# Patient Record
Sex: Male | Born: 1940 | Race: White | Hispanic: No | Smoking: Current every day smoker
Health system: Southern US, Community
[De-identification: ages and names within clinical notes are randomized; demographics above are authoritative.]

## PROBLEM LIST (undated history)

## (undated) DIAGNOSIS — R079 Chest pain, unspecified: Secondary | ICD-10-CM

## (undated) DIAGNOSIS — E669 Obesity, unspecified: Secondary | ICD-10-CM

## (undated) DIAGNOSIS — R0602 Shortness of breath: Secondary | ICD-10-CM

## (undated) HISTORY — DX: Shortness of breath: R06.02

## (undated) HISTORY — DX: Chest pain, unspecified: R07.9

## (undated) HISTORY — DX: Obesity, unspecified: E66.9

---

## 2006-09-17 ENCOUNTER — Ambulatory Visit: Payer: Self-pay | Admitting: Cardiology

## 2006-09-17 ENCOUNTER — Inpatient Hospital Stay (HOSPITAL_COMMUNITY): Admission: EM | Admit: 2006-09-17 | Discharge: 2006-09-21 | Payer: Self-pay | Admitting: Emergency Medicine

## 2006-09-20 ENCOUNTER — Encounter: Payer: Self-pay | Admitting: Vascular Surgery

## 2006-09-23 ENCOUNTER — Ambulatory Visit: Payer: Self-pay | Admitting: Cardiology

## 2006-09-30 ENCOUNTER — Ambulatory Visit: Payer: Self-pay | Admitting: Cardiology

## 2006-10-08 ENCOUNTER — Ambulatory Visit: Payer: Self-pay | Admitting: Cardiology

## 2006-10-08 ENCOUNTER — Encounter: Payer: Self-pay | Admitting: Nurse Practitioner

## 2006-10-15 ENCOUNTER — Ambulatory Visit: Payer: Self-pay | Admitting: Internal Medicine

## 2006-10-26 ENCOUNTER — Ambulatory Visit: Payer: Self-pay | Admitting: Cardiology

## 2006-11-16 ENCOUNTER — Ambulatory Visit: Payer: Self-pay | Admitting: Cardiology

## 2006-12-16 ENCOUNTER — Ambulatory Visit: Payer: Self-pay | Admitting: Cardiology

## 2006-12-24 ENCOUNTER — Ambulatory Visit: Payer: Self-pay | Admitting: Internal Medicine

## 2007-01-20 ENCOUNTER — Ambulatory Visit: Payer: Self-pay | Admitting: Internal Medicine

## 2007-01-20 ENCOUNTER — Ambulatory Visit: Payer: Self-pay | Admitting: Cardiology

## 2007-02-17 ENCOUNTER — Ambulatory Visit: Payer: Self-pay | Admitting: Internal Medicine

## 2007-02-17 ENCOUNTER — Ambulatory Visit: Payer: Self-pay | Admitting: Cardiology

## 2007-02-17 LAB — CONVERTED CEMR LAB
Calcium: 9.2 mg/dL (ref 8.4–10.5)
Chloride: 106 meq/L (ref 96–112)
GFR calc non Af Amer: 90 mL/min

## 2007-03-03 ENCOUNTER — Ambulatory Visit: Payer: Self-pay | Admitting: Internal Medicine

## 2007-03-31 ENCOUNTER — Ambulatory Visit: Payer: Self-pay | Admitting: Cardiology

## 2007-04-28 ENCOUNTER — Ambulatory Visit: Payer: Self-pay | Admitting: Cardiology

## 2007-06-01 ENCOUNTER — Ambulatory Visit: Payer: Self-pay | Admitting: Cardiology

## 2007-07-25 ENCOUNTER — Ambulatory Visit: Payer: Self-pay | Admitting: Cardiology

## 2007-08-08 ENCOUNTER — Ambulatory Visit: Payer: Self-pay | Admitting: Cardiology

## 2007-09-05 ENCOUNTER — Ambulatory Visit: Payer: Self-pay | Admitting: Cardiology

## 2007-09-19 ENCOUNTER — Ambulatory Visit: Payer: Self-pay | Admitting: Internal Medicine

## 2007-10-12 ENCOUNTER — Ambulatory Visit: Payer: Self-pay | Admitting: Cardiology

## 2007-12-05 ENCOUNTER — Ambulatory Visit: Payer: Self-pay | Admitting: Internal Medicine

## 2008-01-02 ENCOUNTER — Ambulatory Visit: Payer: Self-pay | Admitting: Cardiovascular Disease

## 2008-01-16 ENCOUNTER — Ambulatory Visit: Payer: Self-pay | Admitting: Internal Medicine

## 2008-01-16 LAB — CONVERTED CEMR LAB
ALT: 35 units/L (ref 0–53)
Alkaline Phosphatase: 64 units/L (ref 39–117)
BUN: 10 mg/dL (ref 6–23)
Basophils Relative: 0.4 % (ref 0.0–1.0)
CO2: 33 meq/L — ABNORMAL HIGH (ref 19–32)
Eosinophils Absolute: 0.3 10*3/uL (ref 0.0–0.7)
Eosinophils Relative: 3.4 % (ref 0.0–5.0)
GFR calc Af Amer: 96 mL/min
GFR calc non Af Amer: 79 mL/min
HDL: 39.2 mg/dL (ref >39.0)
Hemoglobin: 18.3 g/dL — ABNORMAL HIGH (ref 13.0–17.0)
Monocytes Absolute: 0.8 10*3/uL (ref 0.1–1.0)
Monocytes Relative: 10.6 % (ref 3.0–12.0)
Total Bilirubin: 1.1 mg/dL (ref 0.3–1.2)
Total CHOL/HDL Ratio: 3.6
Total Protein: 6.7 g/dL (ref 6.0–8.3)
Triglycerides: 106 mg/dL (ref 0–149)
VLDL: 21 mg/dL (ref 0–40)
WBC: 7.7 10*3/uL (ref 4.5–10.5)

## 2008-01-23 ENCOUNTER — Ambulatory Visit: Payer: Self-pay | Admitting: Internal Medicine

## 2008-01-30 ENCOUNTER — Ambulatory Visit: Payer: Self-pay | Admitting: Internal Medicine

## 2008-01-30 ENCOUNTER — Ambulatory Visit (HOSPITAL_COMMUNITY): Admission: RE | Admit: 2008-01-30 | Discharge: 2008-01-30 | Payer: Self-pay | Admitting: Internal Medicine

## 2008-02-13 ENCOUNTER — Ambulatory Visit: Payer: Self-pay | Admitting: Cardiovascular Disease

## 2008-03-12 ENCOUNTER — Ambulatory Visit: Payer: Self-pay | Admitting: Cardiology

## 2008-04-06 ENCOUNTER — Ambulatory Visit: Payer: Self-pay | Admitting: Cardiology

## 2008-04-27 ENCOUNTER — Ambulatory Visit: Payer: Self-pay | Admitting: Cardiovascular Disease

## 2008-05-25 ENCOUNTER — Ambulatory Visit: Payer: Self-pay | Admitting: Cardiovascular Disease

## 2008-06-22 ENCOUNTER — Ambulatory Visit: Payer: Self-pay | Admitting: Internal Medicine

## 2008-07-20 ENCOUNTER — Ambulatory Visit: Payer: Self-pay | Admitting: Internal Medicine

## 2008-08-17 ENCOUNTER — Ambulatory Visit: Payer: Self-pay | Admitting: Cardiology

## 2008-09-10 ENCOUNTER — Ambulatory Visit: Payer: Self-pay | Admitting: Cardiovascular Disease

## 2008-09-21 IMAGING — CT CT CHEST W/O CM
2 of 3 series · 15 of 36 positions shown, 18 images · non-contrast
Comparison: 01/20/2007 and 09/17/2006

CLINICAL DATA: Left lung nodule

CT CHEST WITHOUT CONTRAST
TECHNIQUE: Multidetector CT imaging of the chest was performed
following the standard protocol without IV contrast.

[Series 2: chest_hi_res 5.0 b40f st · axial · 0.90mm/px · z∈[-290,-56]mm · 12 of 57 slices shown, 15 images]
[im 5/57  mediastinal]
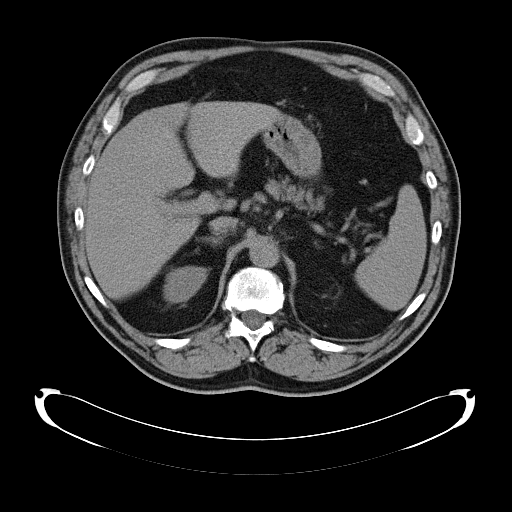
[im 5/57  lung]
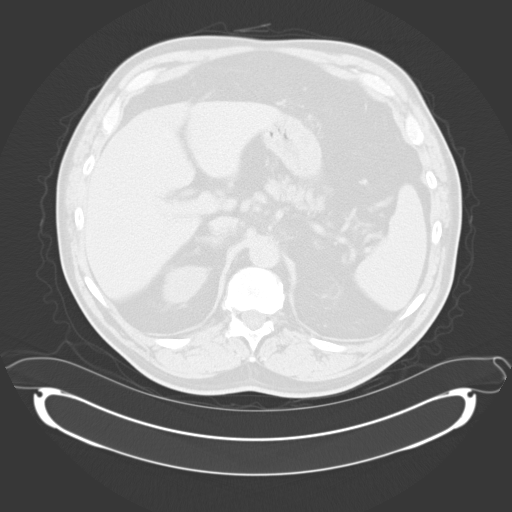
[im 9/57  lung]
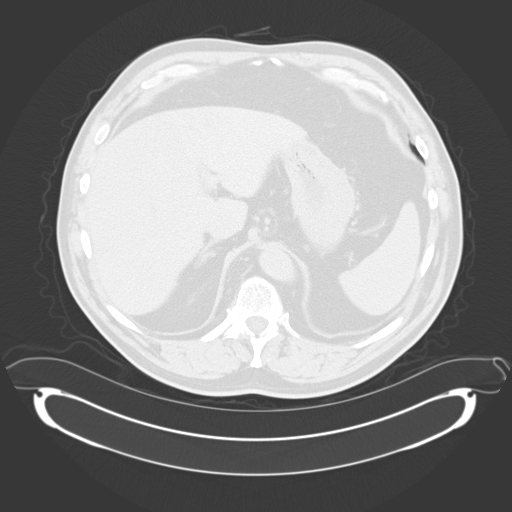
[im 13/57  lung]
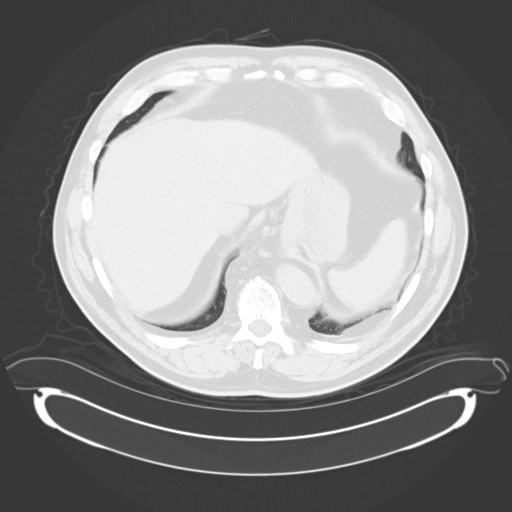
[im 17/57  lung]
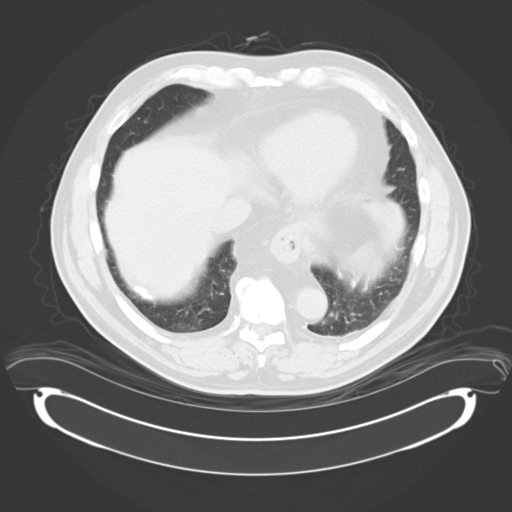
[im 21/57  mediastinal]
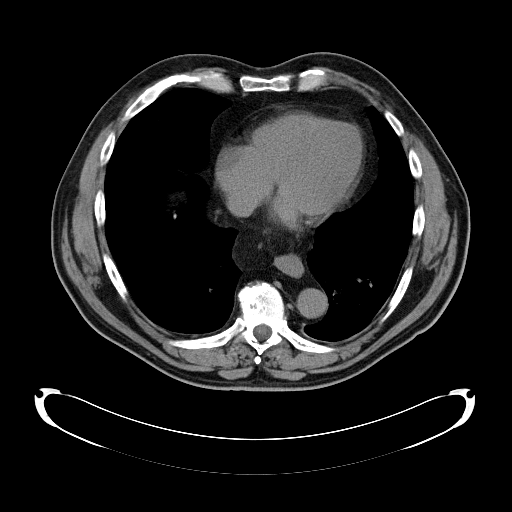
[im 21/57  lung]
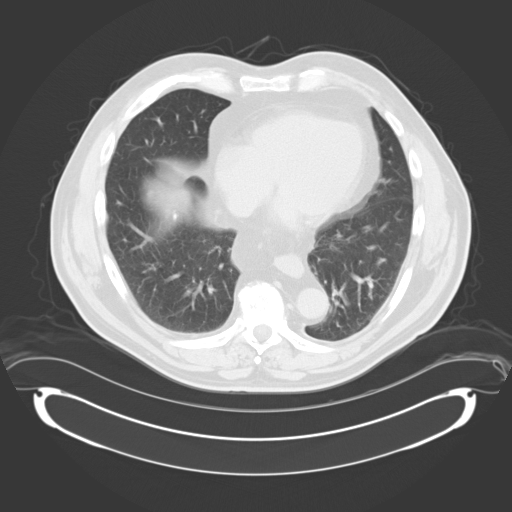
[im 25/57  lung]
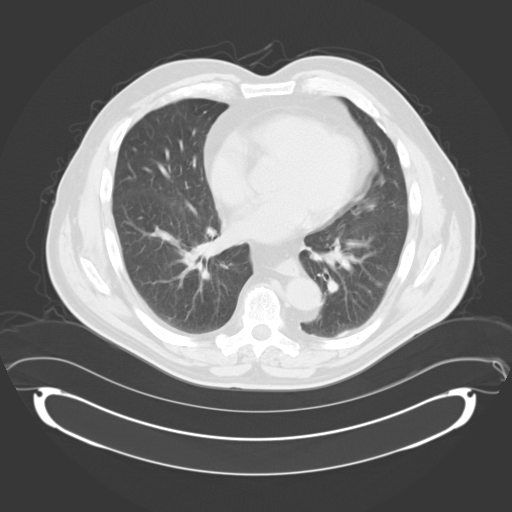
[im 32/57  lung]
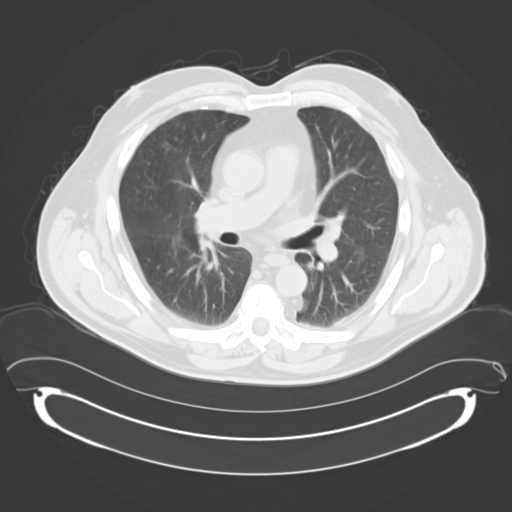
[im 36/57  lung]
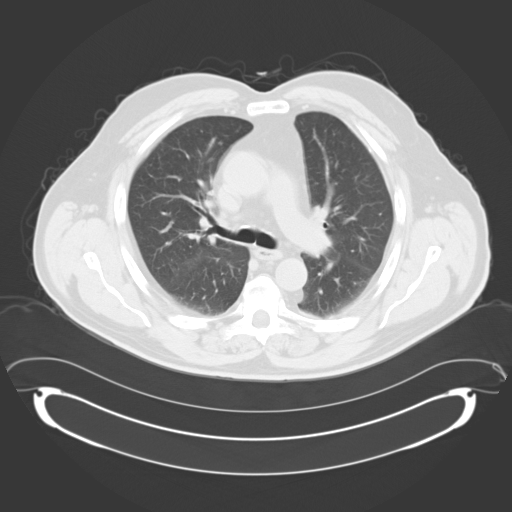
[im 40/57  mediastinal]
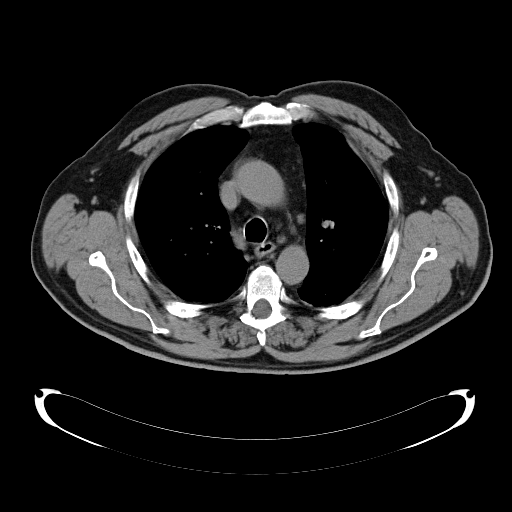
[im 40/57  lung]
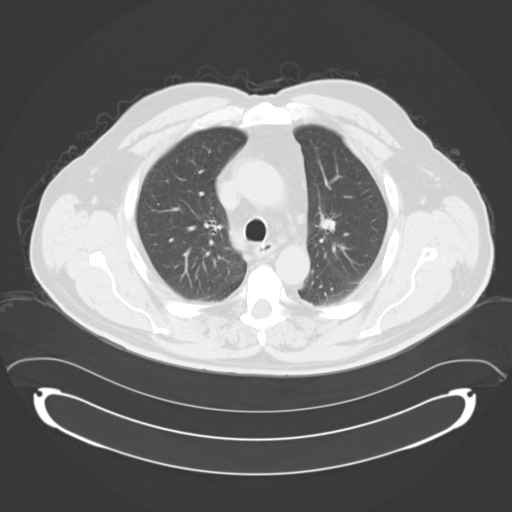
[im 44/57  lung]
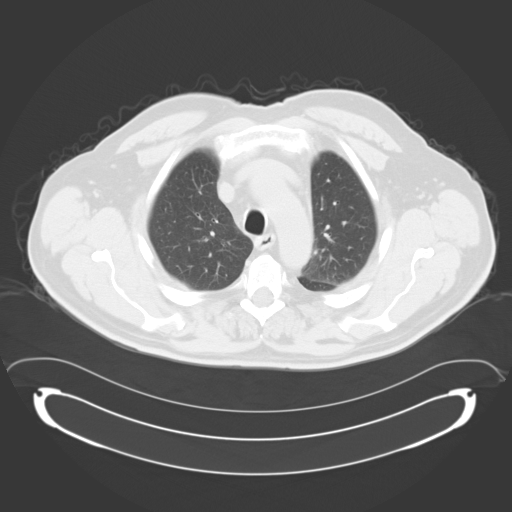
[im 48/57  lung]
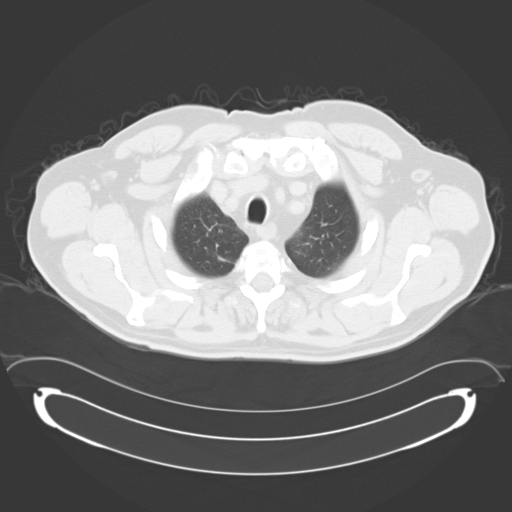
[im 52/57  lung]
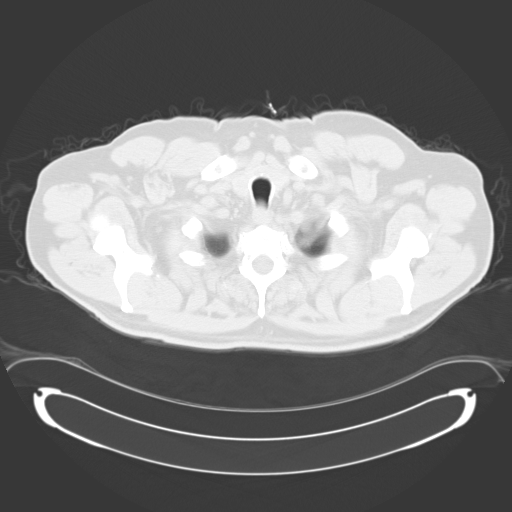

[Series 602: <mpr thick range> · coronal · 0.90mm/px · 3 of 103 slices shown]
[im 21/103  lung]
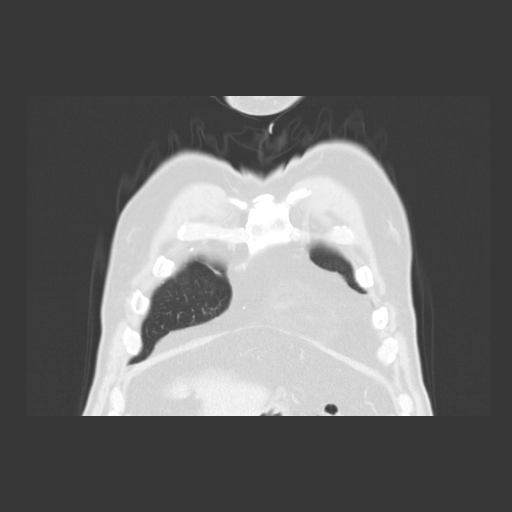
[im 41/103  lung]
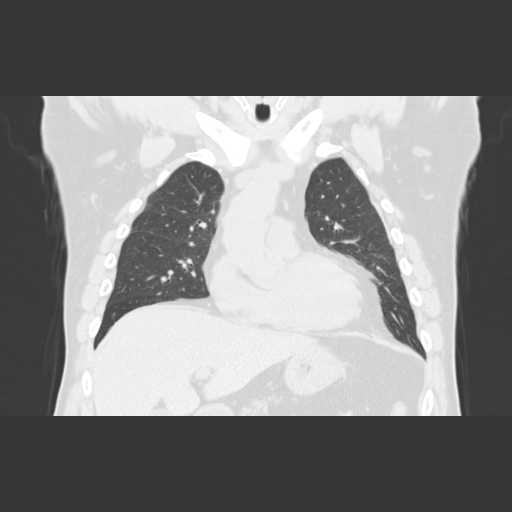
[im 62/103  lung]
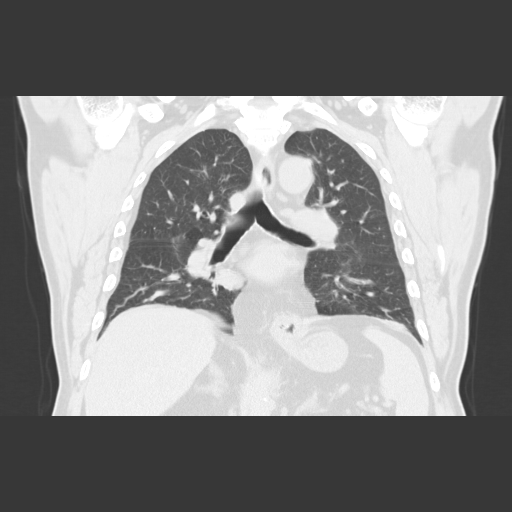

[15 of 36 positions shown; findings below may reference images not displayed]

FINDINGS: There is no axillary, mediastinal, or hilar
lymphadenopathy.  Heart size is normal.  No pericardial or pleural
effusion.  A small hiatal hernia is noted.

The the patient is breathing during image acquisition which
obscures fine detail of the lung parenchyma.  The tiny subpleural
left lower lobe pulmonary nodule is unchanged since the 09/17/2006
exam.  On the more recent comparison study a nodule was seen in the
posterior right apex .  This is less prominent in the interval and
remains compatible with scarring.  A focal area of fatty pleural
thickening in the posterior left hemithorax is stable.  A pleural
calcification at the right lung base is unchanged.
IMPRESSION: Stable appearance of a 4 mm left lower lobe subpleural nodule in
the 12 months since the prior study with 16 months of total imaging
stability.  In a patient with no history of malignancy or smoking,
no additional follow-up of this nodule is indicated.  In a high
risk patient, one additional scan in 8-12 months could be performed
to document 24 months of imaging stability.

## 2008-10-01 ENCOUNTER — Ambulatory Visit: Payer: Self-pay | Admitting: Cardiology

## 2008-10-29 ENCOUNTER — Ambulatory Visit: Payer: Self-pay | Admitting: Cardiology

## 2008-11-19 ENCOUNTER — Ambulatory Visit: Payer: Self-pay | Admitting: Cardiology

## 2008-12-19 ENCOUNTER — Ambulatory Visit: Payer: Self-pay | Admitting: Cardiology

## 2009-01-16 ENCOUNTER — Ambulatory Visit: Payer: Self-pay | Admitting: Internal Medicine

## 2009-02-18 ENCOUNTER — Ambulatory Visit: Payer: Self-pay | Admitting: Cardiovascular Disease

## 2009-03-12 ENCOUNTER — Encounter: Payer: Self-pay | Admitting: *Deleted

## 2009-03-12 ENCOUNTER — Ambulatory Visit: Payer: Self-pay | Admitting: Cardiovascular Disease

## 2009-04-09 ENCOUNTER — Ambulatory Visit: Payer: Self-pay | Admitting: Cardiology

## 2009-04-09 LAB — CONVERTED CEMR LAB: POC INR: 2.7

## 2009-04-16 ENCOUNTER — Encounter: Payer: Self-pay | Admitting: Cardiology

## 2009-04-17 ENCOUNTER — Encounter: Payer: Self-pay | Admitting: *Deleted

## 2009-05-07 ENCOUNTER — Ambulatory Visit: Payer: Self-pay | Admitting: Cardiology

## 2009-05-07 LAB — CONVERTED CEMR LAB: POC INR: 2.3

## 2009-06-04 ENCOUNTER — Ambulatory Visit: Payer: Self-pay | Admitting: Cardiovascular Disease

## 2009-06-04 LAB — CONVERTED CEMR LAB: POC INR: 2.1

## 2009-07-09 ENCOUNTER — Ambulatory Visit: Payer: Self-pay | Admitting: Cardiology

## 2009-07-09 LAB — CONVERTED CEMR LAB: POC INR: 2.7

## 2009-08-06 ENCOUNTER — Ambulatory Visit: Payer: Self-pay | Admitting: Cardiology

## 2009-08-27 ENCOUNTER — Ambulatory Visit: Payer: Self-pay | Admitting: Cardiology

## 2009-09-24 ENCOUNTER — Ambulatory Visit: Payer: Self-pay | Admitting: Cardiovascular Disease

## 2009-09-24 LAB — CONVERTED CEMR LAB: POC INR: 2.3

## 2009-10-24 ENCOUNTER — Ambulatory Visit: Payer: Self-pay | Admitting: Cardiology

## 2009-11-21 ENCOUNTER — Ambulatory Visit: Payer: Self-pay | Admitting: Cardiology

## 2009-12-12 ENCOUNTER — Ambulatory Visit: Payer: Self-pay | Admitting: Internal Medicine

## 2010-01-02 ENCOUNTER — Ambulatory Visit: Payer: Self-pay | Admitting: Internal Medicine

## 2010-01-02 LAB — CONVERTED CEMR LAB: POC INR: 3

## 2010-03-04 ENCOUNTER — Encounter (INDEPENDENT_AMBULATORY_CARE_PROVIDER_SITE_OTHER): Payer: Self-pay | Admitting: Pharmacist

## 2010-03-19 ENCOUNTER — Ambulatory Visit: Payer: Self-pay | Admitting: Cardiology

## 2010-03-19 LAB — CONVERTED CEMR LAB: POC INR: 2.8

## 2010-04-17 ENCOUNTER — Ambulatory Visit: Payer: Self-pay | Admitting: Internal Medicine

## 2010-04-17 LAB — CONVERTED CEMR LAB: POC INR: 2

## 2010-05-15 ENCOUNTER — Ambulatory Visit: Payer: Self-pay | Admitting: Internal Medicine

## 2010-06-12 ENCOUNTER — Ambulatory Visit: Payer: Self-pay | Admitting: Cardiology

## 2010-06-12 LAB — CONVERTED CEMR LAB: POC INR: 2.4

## 2010-07-10 ENCOUNTER — Ambulatory Visit: Payer: Self-pay | Admitting: Cardiology

## 2010-07-10 LAB — CONVERTED CEMR LAB: POC INR: 3

## 2010-08-07 ENCOUNTER — Ambulatory Visit: Payer: Self-pay | Admitting: Cardiology

## 2010-09-03 ENCOUNTER — Ambulatory Visit: Payer: Self-pay | Admitting: Internal Medicine

## 2010-09-03 LAB — CONVERTED CEMR LAB: POC INR: 2.3

## 2010-10-01 ENCOUNTER — Ambulatory Visit: Payer: Self-pay | Admitting: Internal Medicine

## 2010-10-01 LAB — CONVERTED CEMR LAB: POC INR: 2

## 2010-10-29 ENCOUNTER — Ambulatory Visit: Admission: RE | Admit: 2010-10-29 | Discharge: 2010-10-29 | Payer: Self-pay | Source: Home / Self Care

## 2010-11-02 ENCOUNTER — Encounter: Payer: Self-pay | Admitting: Internal Medicine

## 2010-11-11 NOTE — Medication Information (Signed)
Summary: cvrr  Anticoagulant Therapy  Managed by: Elaina Pattee, PharmD Referring MD: Arvilla Meres Supervising MD: Juanda Chance MD, Draco Malczewski Indication 1: Pulmonary embolus (ICD-415.19) Lab Used: [facility] Site: [facility] INR POC 2.8 INR RANGE 2 - 3  Dietary changes: no    Health status changes: no    Bleeding/hemorrhagic complications: no    Recent/future hospitalizations: no    Any changes in medication regimen? no    Recent/future dental: no  Any missed doses?: no       Is patient compliant with meds? yes       Allergies: No Known Drug Allergies  Anticoagulation Management History:      The patient is taking warfarin and comes in today for a routine follow up visit.  Positive risk factors for bleeding include an age of 11 years or older.  The bleeding index is 'intermediate risk'.  Negative CHADS2 values include Age > 50 years old.  The start date was 09/17/2006.  Anticoagulation responsible provider: Juanda Chance MD, Smitty Cords.  INR POC: 2.8.  Cuvette Lot#: [id number].  Exp: 05/2011.    Anticoagulation Management Assessment/Plan:      The patient's current anticoagulation dose is Coumadin 5 mg tabs: Use as directed by anticoagulation clinic.  The target INR is 2 - 3.  The next INR is due 04/16/2010.  Anticoagulation instructions were given to patient.  Results were reviewed/authorized by Elaina Pattee, PharmD.  He was notified by Elaina Pattee, PharmD.         Prior Anticoagulation Instructions: INR 3.0  Continue taking 1/2 tablet on Tuesday, Thursday, and Saturday, and take 1 tablet all other days.  Return to clinic in 4 weeks.  May restart greens in diet.    Current Anticoagulation Instructions: INR 2.8. Take 1 tablet daily except 0.5 tablet on Tues, Thurs, Sat.  Recheck in 4 weeks.

## 2010-11-11 NOTE — Medication Information (Signed)
Summary: [facility]  Anticoagulant Therapy  Managed by: Cloyde Reams, RN, BSN Referring MD: Arvilla Meres Supervising MD: Tenny Craw MD, Gunnar Fusi Indication 1: Pulmonary embolus (ICD-415.19) Lab Used: [facility] Site: [facility] INR POC 1.6 INR RANGE 2 - 3  Dietary changes: yes       Details: Less vit K this past week.  Health status changes: no    Bleeding/hemorrhagic complications: no    Recent/future hospitalizations: no    Any changes in medication regimen? no    Recent/future dental: no  Any missed doses?: no       Is patient compliant with meds? yes       Allergies (verified): No Known Drug Allergies  Anticoagulation Management History:      The patient is taking warfarin and comes in today for a routine follow up visit.  Positive risk factors for bleeding include an age of 71 years or older.  The bleeding index is 'intermediate risk'.  Negative CHADS2 values include Age > 69 years old.  The start date was 09/17/2006.  Anticoagulation responsible provider: Tenny Craw MD, Gunnar Fusi.  INR POC: 1.6.  Cuvette Lot#: [id number].  Exp: 02/2011.    Anticoagulation Management Assessment/Plan:      The patient's current anticoagulation dose is Coumadin 5 mg tabs: Use as directed by anticoagulation clinic.  The target INR is 2 - 3.  The next INR is due 01/02/2010.  Anticoagulation instructions were given to patient.  Results were reviewed/authorized by Cloyde Reams, RN, BSN.  He was notified by Cloyde Reams RN.         Prior Anticoagulation Instructions: Take 5mg  today (Thursday) then resume 2.5mg  daily except 5mg  on MWF.  Current Anticoagulation Instructions: INR 1.6  Take 5mg  today then start taking 1 tablet daily except 1/2 tablet on Tuesdays, Thursdays, and Saturdays.  Recheck 3 weeks.

## 2010-11-11 NOTE — Medication Information (Signed)
Summary: rov/ewj  Anticoagulant Therapy  Managed by: Eda Keys, PharmD Referring MD: Arvilla Meres Supervising MD: Gala Romney MD, Reuel Boom Indication 1: Pulmonary embolus (ICD-415.19) Lab Used: [facility] Site: [facility] INR POC 3.0 INR RANGE 2 - 3  Dietary changes: yes       Details: Pt has stopped eating as many greens due to previously low INRs  Health status changes: no    Bleeding/hemorrhagic complications: no    Recent/future hospitalizations: no    Any changes in medication regimen? no    Recent/future dental: no  Any missed doses?: no       Is patient compliant with meds? yes       Allergies: No Known Drug Allergies  Anticoagulation Management History:      The patient is taking warfarin and comes in today for a routine follow up visit.  Positive risk factors for bleeding include an age of 71 years or older.  The bleeding index is 'intermediate risk'.  Negative CHADS2 values include Age > 71 years old.  The start date was 09/17/2006.  Anticoagulation responsible provider: Reginae Wolfrey MD, Reuel Boom.  INR POC: 3.0.  Cuvette Lot#: [id number].  Exp: 02/2011.    Anticoagulation Management Assessment/Plan:      The patient's current anticoagulation dose is Coumadin 5 mg tabs: Use as directed by anticoagulation clinic.  The target INR is 2 - 3.  The next INR is due 01/30/2010.  Anticoagulation instructions were given to patient.  Results were reviewed/authorized by Eda Keys, PharmD.  He was notified by Eda Keys.         Prior Anticoagulation Instructions: INR 1.6  Take 5mg  today then start taking 1 tablet daily except 1/2 tablet on Tuesdays, Thursdays, and Saturdays.  Recheck 3 weeks.    Current Anticoagulation Instructions: INR 3.0  Continue taking 1/2 tablet on Tuesday, Thursday, and Saturday, and take 1 tablet all other days.  Return to clinic in 4 weeks.  May restart greens in diet.

## 2010-11-11 NOTE — Medication Information (Signed)
Summary: rov/sp  Anticoagulant Therapy  Managed by: Cloyde Reams, RN, BSN Referring MD: Arvilla Meres Supervising MD: Tenny Craw MD, Gunnar Fusi Indication 1: Pulmonary embolus (ICD-415.19) Lab Used: [facility] Site: [facility] INR POC 2.0 INR RANGE 2 - 3  Dietary changes: no    Health status changes: no    Bleeding/hemorrhagic complications: no    Recent/future hospitalizations: no    Any changes in medication regimen? no    Recent/future dental: no  Any missed doses?: yes     Details: Pt went off coumadin x 8 days to donate blood, resumed coumadin 2 weeks ago.   Is patient compliant with meds? yes       Allergies: No Known Drug Allergies  Anticoagulation Management History:      The patient is taking warfarin and comes in today for a routine follow up visit.  Positive risk factors for bleeding include an age of 71 years or older.  The bleeding index is 'intermediate risk'.  Negative CHADS2 values include Age > 73 years old.  The start date was 09/17/2006.  Anticoagulation responsible provider: Tenny Craw MD, Gunnar Fusi.  INR POC: 2.0.  Cuvette Lot#: [id number].  Exp: 06/2011.    Anticoagulation Management Assessment/Plan:      The patient's current anticoagulation dose is Coumadin 5 mg tabs: Use as directed by anticoagulation clinic.  The target INR is 2 - 3.  The next INR is due 05/15/2010.  Anticoagulation instructions were given to patient.  Results were reviewed/authorized by Cloyde Reams, RN, BSN.  He was notified by Cloyde Reams RN.         Prior Anticoagulation Instructions: INR 2.8. Take 1 tablet daily except 0.5 tablet on Tues, Thurs, Sat.  Recheck in 4 weeks.  Current Anticoagulation Instructions: INR 2.0  Continue on same dosage 1 tablet daily except 1/2 tablet on Tuesdays, Thursdays, and Saturdays.  Recheck in 4 weeks.

## 2010-11-11 NOTE — Medication Information (Signed)
Summary: [facility]  Anticoagulant Therapy  Managed by: Bethena Midget, RN Referring MD: Arvilla Meres Supervising MD: Shirlee Latch MD, Breanna Mcdaniel Indication 1: Pulmonary embolus (ICD-415.19) Lab Used: [facility] Site: [facility] INR POC 2.2 INR RANGE 2 - 3  Dietary changes: no    Health status changes: no    Bleeding/hemorrhagic complications: no    Recent/future hospitalizations: no    Any changes in medication regimen? no    Recent/future dental: no  Any missed doses?: no       Is patient compliant with meds? yes       Allergies (verified): No Known Drug Allergies  Anticoagulation Management History:      The patient is taking warfarin and comes in today for a routine follow up visit.  Positive risk factors for bleeding include an age of 71 years or older.  The bleeding index is 'intermediate risk'.  Negative CHADS2 values include Age > 71 years old.  The start date was 09/17/2006.  Anticoagulation responsible provider: Shirlee Latch MD, Aamiyah Derrick.  INR POC: 2.2.  Cuvette Lot#: [id number].  Exp: [id number].    Anticoagulation Management Assessment/Plan:      The patient's current anticoagulation dose is Coumadin 5 mg tabs: Use as directed by anticoagulation clinic.  The target INR is 2 - 3.  The next INR is due 11/21/2009.  Anticoagulation instructions were given to patient.  Results were reviewed/authorized by Bethena Midget, RN.  He was notified by Ysidro Evert, Pharm D Candidate.         Prior Anticoagulation Instructions: INR 2.3  Continue on same dosage 2.5mg  daily except 5mg  on Mondays, Wednesdays, and Fridays.  Recheck in 4 weeks.    Current Anticoagulation Instructions: INR: 2.2 Continue with same dosage 2.5mg  daily except 5mg  on Mondays, Wednesdays and Fridays Recheck in 4 weeks

## 2010-11-11 NOTE — Medication Information (Signed)
Summary: rov/ewj  Anticoagulant Therapy  Managed by: Cloyde Reams, RN, BSN Referring MD: Arvilla Meres Supervising MD: Tenny Craw MD, Gunnar Fusi Indication 1: Pulmonary embolus (ICD-415.19) Lab Used: [facility] Site: [facility] INR POC 2.1 INR RANGE 2 - 3  Dietary changes: yes       Details: Incr vit K x past 3 days.    Health status changes: no    Bleeding/hemorrhagic complications: no    Recent/future hospitalizations: no    Any changes in medication regimen? no    Recent/future dental: no  Any missed doses?: no       Is patient compliant with meds? yes       Allergies: No Known Drug Allergies  Anticoagulation Management History:      The patient is taking warfarin and comes in today for a routine follow up visit.  Positive risk factors for bleeding include an age of 71 years or older.  The bleeding index is 'intermediate risk'.  Negative CHADS2 values include Age > 9 years old.  The start date was 09/17/2006.  Anticoagulation responsible provider: Tenny Craw MD, Gunnar Fusi.  INR POC: 2.1.  Cuvette Lot#: [id number].  Exp: 07/2011.    Anticoagulation Management Assessment/Plan:      The patient's current anticoagulation dose is Coumadin 5 mg tabs: Use as directed by anticoagulation clinic.  The target INR is 2 - 3.  The next INR is due 06/12/2010.  Anticoagulation instructions were given to patient.  Results were reviewed/authorized by Cloyde Reams, RN, BSN.  He was notified by Cloyde Reams RN.         Prior Anticoagulation Instructions: INR 2.0  Continue on same dosage 1 tablet daily except 1/2 tablet on Tuesdays, Thursdays, and Saturdays.  Recheck in 4 weeks.   Current Anticoagulation Instructions: INR 2.1  Continue on same dosage 1 tablet daily except 1/2 tablet on Tuesdays, Thursdays, and Saturdays.  Recheck in 4 weeks.

## 2010-11-11 NOTE — Letter (Signed)
Summary: [facility]ou[facility]in  [address]   [place], [place] [address]   Phone: [phone]  Fax: [phone]     Mar 04, 2010 MRN: [id number]   Jake Richmond [address] [place], [place]  [address]   Dear [name],  This letter is being sent to you as a reminder that it is necessary for you to get your INR/PT checked regularly so that we can optimize your care.  Our records indicate that you were scheduled to have a test done recently.  As of today, we have not received the results of this test.  It is very important that you have your INR checked.  Please call our office at the number listed above to schedule an appointment at your earliest convenience.    If you have recently had your protime checked or have discontinued this medication, please contact our office at the above phone number to clarify this issue.  Thank you for this prompt attention to this important health care matter.  Sincerely,   [facility] Team

## 2010-11-11 NOTE — Medication Information (Signed)
Summary: rov/ewj  Anticoagulant Therapy  Managed by: Weston Brass, PharmD Referring MD: Arvilla Meres Supervising MD: Jens Som MD, Arlys John Indication 1: Pulmonary embolus (ICD-415.19) Lab Used: [facility] Site: [facility] INR POC 2.4 INR RANGE 2 - 3  Dietary changes: yes       Details: decreased vitamin k intake   Health status changes: no    Bleeding/hemorrhagic complications: no    Recent/future hospitalizations: no    Any changes in medication regimen? no    Recent/future dental: no  Any missed doses?: no       Is patient compliant with meds? yes       Allergies: No Known Drug Allergies  Anticoagulation Management History:      The patient is taking warfarin and comes in today for a routine follow up visit.  Positive risk factors for bleeding include an age of 71 years or older.  The bleeding index is 'intermediate risk'.  Negative CHADS2 values include Age > 69 years old.  The start date was 09/17/2006.  Anticoagulation responsible provider: Jens Som MD, Arlys John.  INR POC: 2.4.  Cuvette Lot#: [id number].  Exp: 07/2011.    Anticoagulation Management Assessment/Plan:      The patient's current anticoagulation dose is Coumadin 5 mg tabs: Use as directed by anticoagulation clinic.  The target INR is 2 - 3.  The next INR is due 07/10/2010.  Anticoagulation instructions were given to patient.  Results were reviewed/authorized by Weston Brass, PharmD.  He was notified by Weston Brass PharmD.         Prior Anticoagulation Instructions: INR 2.1  Continue on same dosage 1 tablet daily except 1/2 tablet on Tuesdays, Thursdays, and Saturdays.  Recheck in 4 weeks.   Current Anticoagulation Instructions: INR 2.4  Continue same dose of 1 tablet every day except 1/2 tablet on Tuesday, Thursday and Saturday.  Recheck INR in 4 weeks.

## 2010-11-11 NOTE — Medication Information (Signed)
Summary: rov/eac  Anticoagulant Therapy  Managed by: Charolotte Eke, PharmD Referring MD: Arvilla Meres Supervising MD: Daleen Squibb MD, Maisie Fus Indication 1: Pulmonary embolus (ICD-415.19) Lab Used: [facility] Site: [facility] INR POC 1.9 INR RANGE 2 - 3  Dietary changes: no    Health status changes: no    Bleeding/hemorrhagic complications: no    Recent/future hospitalizations: no    Any changes in medication regimen? no    Recent/future dental: no  Any missed doses?: no       Is patient compliant with meds? yes       Current Medications (verified): 1)  Aspirin 81 Mg Tbec (Aspirin) .... Take One Tablet By Mouth Daily 2)  Coumadin 5 Mg Tabs (Warfarin Sodium) .... Use As Directed By Anticoagulation Clinic  Allergies (verified): No Known Drug Allergies  Anticoagulation Management History:      Positive risk factors for bleeding include an age of 16 years or older.  The bleeding index is 'intermediate risk'.  Negative CHADS2 values include Age > 44 years old.  The start date was 09/17/2006.  Anticoagulation responsible provider: Daleen Squibb MD, Maisie Fus.  INR POC: 1.9.  Exp: [id number].    Anticoagulation Management Assessment/Plan:      The patient's current anticoagulation dose is Coumadin 5 mg tabs: Use as directed by anticoagulation clinic.  The target INR is 2 - 3.  The next INR is due 12/12/2009.  Anticoagulation instructions were given to patient.  Results were reviewed/authorized by Charolotte Eke, PharmD.  He was notified by Charolotte Eke, PharmD.         Prior Anticoagulation Instructions: INR: 2.2 Continue with same dosage 2.5mg  daily except 5mg  on Mondays, Wednesdays and Fridays Recheck in 4 weeks  Current Anticoagulation Instructions: Take 5mg  today (Thursday) then resume 2.5mg  daily except 5mg  on MWF.

## 2010-11-11 NOTE — Medication Information (Signed)
Summary: rov/sp   Anticoagulant Therapy  Managed by: Weston Brass, PharmD Referring MD: Arvilla Meres Supervising MD: Jens Som MD, Arlys John Indication 1: Pulmonary embolus (ICD-415.19) Lab Used: [facility] Site: [facility] INR POC 3 INR RANGE 2 - 3  Dietary changes: yes       Details: eating less greens  Health status changes: no    Bleeding/hemorrhagic complications: no    Recent/future hospitalizations: no    Any changes in medication regimen? no    Recent/future dental: no  Any missed doses?: no       Is patient compliant with meds? yes       Allergies: No Known Drug Allergies  Anticoagulation Management History:      The patient is taking warfarin and comes in today for a routine follow up visit.  Positive risk factors for bleeding include an age of 71 years or older.  The bleeding index is 'intermediate risk'.  Negative CHADS2 values include Age > 4 years old.  The start date was 09/17/2006.  Anticoagulation responsible provider: Jens Som MD, Arlys John.  INR POC: 3.  Cuvette Lot#: [id number].  Exp: 07/2011.    Anticoagulation Management Assessment/Plan:      The patient's current anticoagulation dose is Coumadin 5 mg tabs: Use as directed by anticoagulation clinic.  The target INR is 2 - 3.  The next INR is due 08/07/2010.  Anticoagulation instructions were given to patient.  Results were reviewed/authorized by Weston Brass, PharmD.         Prior Anticoagulation Instructions: INR 2.4  Continue same dose of 1 tablet every day except 1/2 tablet on Tuesday, Thursday and Saturday.  Recheck INR in 4 weeks.   Current Anticoagulation Instructions: INR 3 Continue taking a half tablet on tuesday, thursday, and saturday. And 1 tablet all other days. Recheck in 4 weeks.

## 2010-11-11 NOTE — Medication Information (Signed)
Summary: Coumadin Clinic      Allergies Added: NKDA Anticoagulant Therapy  Managed by: Reina Fuse, PharmD Referring MD: Arvilla Meres Supervising MD: Jens Som MD, Arlys John Indication 1: Pulmonary embolus (ICD-415.19) Lab Used: [facility] Site: [facility] INR POC 2.5 INR RANGE 2 - 3  Dietary changes: no    Health status changes: no    Bleeding/hemorrhagic complications: no    Recent/future hospitalizations: no    Any changes in medication regimen? no    Recent/future dental: no  Any missed doses?: yes     Details: Missed dose Tuesday (2.5 mg).  Is patient compliant with meds? yes       Current Medications (verified): 1)  Aspirin 81 Mg Tbec (Aspirin) .... Take One Tablet By Mouth Daily 2)  Coumadin 5 Mg Tabs (Warfarin Sodium) .... Use As Directed By Anticoagulation Clinic  Allergies (verified): No Known Drug Allergies  Anticoagulation Management History:      The patient is taking warfarin and comes in today for a routine follow up visit.  Positive risk factors for bleeding include an age of 71 years or older.  The bleeding index is 'intermediate risk'.  Negative CHADS2 values include Age > 38 years old.  The start date was 09/17/2006.  Anticoagulation responsible Lenka Zhao: Jens Som MD, Arlys John.  INR POC: 2.5.  Cuvette Lot#: [id number].  Exp: 07/2011.    Anticoagulation Management Assessment/Plan:      The patient's current anticoagulation dose is Coumadin 5 mg tabs: Use as directed by anticoagulation clinic.  The target INR is 2 - 3.  The next INR is due 09/03/2010.  Anticoagulation instructions were given to patient.  Results were reviewed/authorized by Reina Fuse, PharmD.  He was notified by Reina Fuse PharmD.         Prior Anticoagulation Instructions: INR 3 Continue taking a half tablet on tuesday, thursday, and saturday. And 1 tablet all other days. Recheck in 4 weeks.  Current Anticoagulation Instructions: INR 2.5  Continue taking Coumadin 5 mg (1 tab) on Sun,  Mon, Wed, Fri and Coumadin 2.5 mg (0.5 tab) on Tues, Thur, Sat. Return to clinic in 4 weeks.

## 2010-11-11 NOTE — Medication Information (Signed)
Summary: rov/sl   Anticoagulant Therapy  Managed by: Weston Brass, PharmD Referring MD: Arvilla Meres Supervising MD: Gala Romney MD, Reuel Boom Indication 1: Pulmonary embolus (ICD-415.19) Lab Used: [facility] Site: [facility] INR POC 2.3 INR RANGE 2 - 3  Dietary changes: yes       Details: decreased greens  Health status changes: no    Bleeding/hemorrhagic complications: no    Recent/future hospitalizations: no    Any changes in medication regimen? no    Recent/future dental: no  Any missed doses?: no       Is patient compliant with meds? yes       Allergies: No Known Drug Allergies  Anticoagulation Management History:      The patient is taking warfarin and comes in today for a routine follow up visit.  Positive risk factors for bleeding include an age of 71 years or older.  The bleeding index is 'intermediate risk'.  Negative CHADS2 values include Age > 41 years old.  The start date was 09/17/2006.  Anticoagulation responsible provider: Ladajah Soltys MD, Reuel Boom.  INR POC: 2.3.  Cuvette Lot#: [id number].  Exp: 09/2011.    Anticoagulation Management Assessment/Plan:      The patient's current anticoagulation dose is Coumadin 5 mg tabs: Use as directed by anticoagulation clinic.  The target INR is 2 - 3.  The next INR is due 10/01/2010.  Anticoagulation instructions were given to patient.  Results were reviewed/authorized by Weston Brass, PharmD.  He was notified by Weston Brass PharmD.         Prior Anticoagulation Instructions: INR 2.5  Continue taking Coumadin 5 mg (1 tab) on Sun, Mon, Wed, Fri and Coumadin 2.5 mg (0.5 tab) on Tues, Thur, Sat. Return to clinic in 4 weeks.   Current Anticoagulation Instructions: INR 2.3  Continue same dose of 1 tablet every day except 1/2 tablet on Tuesday, Thursday and Saturday.  Recheck INR in 4 weeks.

## 2010-11-13 NOTE — Medication Information (Signed)
Summary: rov/sp   Anticoagulant Therapy  Managed by: Weston Brass, PharmD Referring MD: Arvilla Meres Supervising MD: Gala Romney MD, Reuel Boom Indication 1: Pulmonary embolus (ICD-415.19) Lab Used: [facility] Site: [facility] INR POC 2.0 INR RANGE 2 - 3  Dietary changes: no    Health status changes: no    Bleeding/hemorrhagic complications: no    Recent/future hospitalizations: no    Any changes in medication regimen? no    Recent/future dental: no  Any missed doses?: no       Is patient compliant with meds? yes       Allergies: No Known Drug Allergies  Anticoagulation Management History:      The patient is taking warfarin and comes in today for a routine follow up visit.  Positive risk factors for bleeding include an age of 5 years or older.  The bleeding index is 'intermediate risk'.  Negative CHADS2 values include Age > 23 years old.  The start date was 09/17/2006.  Anticoagulation responsible provider: Bensimhon MD, Reuel Boom.  INR POC: 2.0.  Cuvette Lot#: [id number].  Exp: 11/2011.    Anticoagulation Management Assessment/Plan:      The patient's current anticoagulation dose is Coumadin 5 mg tabs: Use as directed by anticoagulation clinic.  The target INR is 2 - 3.  The next INR is due 10/29/2010.  Anticoagulation instructions were given to patient.  Results were reviewed/authorized by Weston Brass, PharmD.  He was notified by Weston Brass PharmD.         Prior Anticoagulation Instructions: INR 2.3  Continue same dose of 1 tablet every day except 1/2 tablet on Tuesday, Thursday and Saturday.  Recheck INR in 4 weeks.   Current Anticoagulation Instructions: INR 2.0  Continue same dose of 1 tablet every day except 1/2 tablet on Tuesday, Thursday and Saturday.  Recheck INR in 4 weeks.

## 2010-11-13 NOTE — Medication Information (Signed)
Summary: rov/tp   Anticoagulant Therapy  Managed by: Geoffry Paradise, PharmD Referring MD: Arvilla Meres Supervising MD: Shirlee Latch MD, Freida Busman Indication 1: Pulmonary embolus (ICD-415.19) Lab Used: [facility] Site: [facility] INR POC 2.7 INR RANGE 2 - 3  Dietary changes: no    Health status changes: no    Bleeding/hemorrhagic complications: no    Recent/future hospitalizations: no    Any changes in medication regimen? no    Recent/future dental: no  Any missed doses?: no       Is patient compliant with meds? yes       Allergies: No Known Drug Allergies  Anticoagulation Management History:      Positive risk factors for bleeding include an age of 71 years or older.  The bleeding index is 'intermediate risk'.  Negative CHADS2 values include Age > 69 years old.  The start date was 09/17/2006.  Anticoagulation responsible provider: Shirlee Latch MD, [place].  INR POC: 2.7.  Cuvette Lot#: [id number].  Exp: 10/2011.    Anticoagulation Management Assessment/Plan:      The patient's current anticoagulation dose is Coumadin 5 mg tabs: Use as directed by anticoagulation clinic.  The target INR is 2 - 3.  The next INR is due 11/26/2010.  Anticoagulation instructions were given to patient.  Results were reviewed/authorized by Geoffry Paradise, PharmD.         Prior Anticoagulation Instructions: INR 2.0  Continue same dose of 1 tablet every day except 1/2 tablet on Tuesday, Thursday and Saturday.  Recheck INR in 4 weeks.   Current Anticoagulation Instructions: INR:  2.7 (goal 2-3)  Your INR is at goal today.  Please continue your Coumadin at 2.5 mg Tue, Thur, Sat and 5 mg on other days of the week.  Return to clinic in 4 weeks for another INR check.

## 2010-11-20 DIAGNOSIS — I2699 Other pulmonary embolism without acute cor pulmonale: Secondary | ICD-10-CM

## 2010-11-26 ENCOUNTER — Encounter (INDEPENDENT_AMBULATORY_CARE_PROVIDER_SITE_OTHER): Payer: Medicare Other

## 2010-11-26 ENCOUNTER — Encounter: Payer: Self-pay | Admitting: Cardiology

## 2010-11-26 DIAGNOSIS — I2699 Other pulmonary embolism without acute cor pulmonale: Secondary | ICD-10-CM

## 2010-11-26 DIAGNOSIS — Z7901 Long term (current) use of anticoagulants: Secondary | ICD-10-CM

## 2010-12-03 NOTE — Medication Information (Signed)
Summary: Coumadin Clinic  Anticoagulant Therapy  Managed by: Cloyde Reams, RN, BSN Referring MD: Arvilla Meres Supervising MD: Shirlee Latch MD, Lendy Dittrich Indication 1: Pulmonary embolus (ICD-415.19) Lab Used: [facility] Site: [facility] INR POC 2.4 INR RANGE 2 - 3  Dietary changes: no    Health status changes: no    Bleeding/hemorrhagic complications: no    Recent/future hospitalizations: no    Any changes in medication regimen? no    Recent/future dental: no  Any missed doses?: no       Is patient compliant with meds? yes       Allergies: No Known Drug Allergies  Anticoagulation Management History:      The patient is taking warfarin and comes in today for a routine follow up visit.  Positive risk factors for bleeding include an age of 71 years or older.  The bleeding index is 'intermediate risk'.  Negative CHADS2 values include Age > 4 years old.  The start date was 09/17/2006.  Anticoagulation responsible provider: Shirlee Latch MD, Judeen Geralds.  INR POC: 2.4.  Cuvette Lot#: [id number].  Exp: 10/2011.    Anticoagulation Management Assessment/Plan:      The patient's current anticoagulation dose is Coumadin 5 mg tabs: Use as directed by anticoagulation clinic.  The target INR is 2 - 3.  The next INR is due 12/24/2010.  Anticoagulation instructions were given to patient.  Results were reviewed/authorized by Cloyde Reams, RN, BSN.  He was notified by Cloyde Reams RN.         Prior Anticoagulation Instructions: INR:  2.7 (goal 2-3)  Your INR is at goal today.  Please continue your Coumadin at 2.5 mg Tue, Thur, Sat and 5 mg on other days of the week.  Return to clinic in 4 weeks for another INR check.    Current Anticoagulation Instructions: INR 2.4  Continue on same dosage 1 tablet daily except 1/2 tablet on Tuesdays, Thursdays, and Saturdays.  Recheck in 4 weeks.   Prescriptions: COUMADIN 5 MG TABS (WARFARIN SODIUM) Use as directed by anticoagulation clinic  #30 x 3   Entered by:    Cloyde Reams RN   Authorized by:   Dolores Patty, MD, [facility]   Signed by:   Cloyde Reams RN on 11/26/2010   Method used:   Electronically to        [facility] (retail)       [address]       [place], [place]  [address]       Ph: [phone]       Fax: [phone]   RxID:   [id number]

## 2010-12-24 ENCOUNTER — Encounter: Payer: Self-pay | Admitting: Cardiology

## 2010-12-24 ENCOUNTER — Encounter (INDEPENDENT_AMBULATORY_CARE_PROVIDER_SITE_OTHER): Payer: Medicare Other

## 2010-12-24 DIAGNOSIS — I2699 Other pulmonary embolism without acute cor pulmonale: Secondary | ICD-10-CM

## 2010-12-24 DIAGNOSIS — Z7901 Long term (current) use of anticoagulants: Secondary | ICD-10-CM

## 2010-12-30 NOTE — Medication Information (Signed)
Summary: rov/ewj  Anticoagulant Therapy  Managed by: Leota Sauers, PharmD, BCPS, CPP Referring MD: Arvilla Meres Supervising MD: Excell Seltzer MD, Casimiro Needle Indication 1: Pulmonary embolus (ICD-415.19) Lab Used: [facility] Site: [facility] INR POC 1.9 INR RANGE 2 - 3  Dietary changes: no    Health status changes: no    Bleeding/hemorrhagic complications: no    Recent/future hospitalizations: no    Any changes in medication regimen? no    Recent/future dental: no  Any missed doses?: yes     Details: to doate blood now back on  Is patient compliant with meds? yes      Comments: came off warfarin to donate blood in teh middle of Feb has been back on warfarin for 2 wwwks  Current Medications (verified): 1)  Aspirin 81 Mg Tbec (Aspirin) .... Take One Tablet By Mouth Daily 2)  Coumadin 5 Mg Tabs (Warfarin Sodium) .... Use As Directed By Anticoagulation Clinic  Allergies (verified): No Known Drug Allergies  Anticoagulation Management History:      The patient is taking warfarin and comes in today for a routine follow up visit.  Positive risk factors for bleeding include an age of 49 years or older.  The bleeding index is 'intermediate risk'.  Negative CHADS2 values include Age > 66 years old.  The start date was 09/17/2006.  Anticoagulation responsible provider: Excell Seltzer MD, Casimiro Needle.  INR POC: 1.9.  Cuvette Lot#: [id number].  Exp: 10/2011.    Anticoagulation Management Assessment/Plan:      The patient's current anticoagulation dose is Coumadin 5 mg tabs: Use as directed by anticoagulation clinic.  The target INR is 2 - 3.  The next INR is due 01/21/2011.  Anticoagulation instructions were given to patient.  Results were reviewed/authorized by Leota Sauers, PharmD, BCPS, CPP.         Prior Anticoagulation Instructions: INR 2.4  Continue on same dosage 1 tablet daily except 1/2 tablet on Tuesdays, Thursdays, and Saturdays.  Recheck in 4 weeks.    Current Anticoagulation  Instructions: INR 1.9  Coumadin 5mg  tabs TAKE 1 Tab tomorrow THUR 3/15  then resume normal dosing 1/2 tab on TU, TH SAT 1 tab all other days

## 2011-01-21 ENCOUNTER — Ambulatory Visit (INDEPENDENT_AMBULATORY_CARE_PROVIDER_SITE_OTHER): Payer: Medicare Other | Admitting: *Deleted

## 2011-01-21 DIAGNOSIS — Z7901 Long term (current) use of anticoagulants: Secondary | ICD-10-CM

## 2011-01-21 DIAGNOSIS — I2699 Other pulmonary embolism without acute cor pulmonale: Secondary | ICD-10-CM

## 2011-02-18 ENCOUNTER — Ambulatory Visit (INDEPENDENT_AMBULATORY_CARE_PROVIDER_SITE_OTHER): Payer: Medicare Other | Admitting: *Deleted

## 2011-02-18 DIAGNOSIS — I2699 Other pulmonary embolism without acute cor pulmonale: Secondary | ICD-10-CM

## 2011-02-18 LAB — POCT INR: INR: 2.7

## 2011-02-24 NOTE — Assessment & Plan Note (Signed)
[facility]                            CARDIOLOGY OFFICE NOTE   DANDRAE, KUSTRA                          MRN:          [id number]  DATE:01/16/2008                            DOB:          [birth year]    PRIMARY CARE PHYSICIAN:  None.   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  He also had cardiac catheterization at that time which  showed normal LV function with nonobstructive coronary artery disease.  He is a smoker.  Does have COPD, and there was mention of left lower  lobe pulmonary nodule back on a CT in May 2008 which needs to be  followed up.   He returns today for team follow-up.  He is still driving his truck.  He  is taking his Coumadin.  He has not had any problems with bleeding, no  problems with chest pain or shortness of breath.  He really has no  complaints.  Unfortunately he did go back to smoking, but on March 20 he  quit using the patch and has managed to not smoke since that time.   CURRENT MEDICATIONS:  1. Aspirin 81.  2. Coumadin 5.  3. Simvastatin 40 at night.  4. HCTZ 25 a day.  5. Potassium 20 a day.   PHYSICAL EXAMINATION:  GENERAL:  No acute distress, ambulates around the  clinic without any respiratory difficulty.  VITAL SIGNS:  Blood pressure was 122/82, heart rate 93, weight 216.  HEENT:  Normal.  NECK:  Supple.  No JVD.  Carotids are 2+ bilaterally without bruits.  There is no lymphadenopathy or thyromegaly.  CARDIAC:  PMI is  nondisplaced.  Regular rate and rhythm.  No murmurs, rubs or gallops.  LUNGS:  Clear with a mildly prolonged expiratory phase, no wheezing.  ABDOMEN:  Soft, nontender, nondistended, no hepatosplenomegaly, no  bruits, no masses appreciated.  EXTREMITIES:  Warm with no cyanosis, clubbing or edema.  No cords, no  rash.  NEUROLOGICAL:  Alert and oriented x3.  Cranial nerves II-XII  intact.  Moves all four extremities without difficulty.  Affect is pleasant.   ASSESSMENT/PLAN:  1. Pulmonary emboli.  He is currently on Coumadin doing well.  We      discussed about the duration of therapy.  I suggested that he      consider lifelong therapy.  He is not sure that he wants to keep it      going this long, but certainly would want to keep it going while he      is still driving that truck.  I agreed with this strategy.  He will      continue to follow with our  Coumadin Clinic.  2. Hypertension well-controlled.  3. Hyperlipidemia.  He is due for a fasting lipid profile and a liver      panel today.  4. Chronic obstructive pulmonary disease with tobacco use.  He has      quit smoking.  5. Pulmonary nodule.  We  will attempt to schedule his follow-up CT in      the near future.   DISPOSITION:  He is doing great.  Will see him back in one year for  follow-up.  I did recommend getting a primary care physician to follow  up on routine screening and preventative issues.     Bevelyn Buckles. Bensimhon, MD  Electronically Signed    DRB/MedQ  DD: 01/16/2008  DT: 01/16/2008  Job #: [id number]

## 2011-02-27 NOTE — Discharge Summary (Signed)
NAME:  Jake Richmond, Jake Richmond NO.:  [id number]   MEDICAL RECORD NO.:  [id number]          PATIENT TYPE:  INP   LOCATION:  2005                         FACILITY:  [facility]   PHYSICIAN:  Gerrit Friends. Dietrich Pates, MD, FACCDATE OF BIRTH:  01/20/1941   DATE OF ADMISSION:  09/17/2006  DATE OF DISCHARGE:  09/21/2006                               DISCHARGE SUMMARY   PRIMARY CARDIOLOGIST:  Dr. Arvilla Meres.   The patient does not have a primary care physician.   PRINCIPAL DIAGNOSIS:  Bilateral pulmonary embolism.   SECONDARY DIAGNOSES:  1. Left lower extremity deep vein thrombosis.  2. Nonobstructive coronary artery disease.  3. Left lower pulmonary nodule requiring follow-up CT in 6 months  4. Chronic obstructive pulmonary disease.  5. Tobacco abuse.  The patient states he has now quit.   ALLERGIES:  NO KNOWN DRUG ALLERGIES.   PROCEDURES:  1. Left heart cardiac catheterization.  2. CT of the chest.  3. Lower extremity ultrasound.   HISTORY OF PRESENT ILLNESS:  A 70 year old Caucasian male, with no prior  history of CAD, who presented to the [facility] ED on 09/17/2006 with a  5-day history of exertional angina.  He was subsequently admitted for  further evaluation.   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  The patient underwent left heart cardiac  catheterization on December 7th revealing normal coronary arteries with  an EF of 60%.  Because of elevated D-dimer, a CT of the chest was  performed and showed significant bilateral pulmonary emboli as well as a  small left lower lobe pulmonary nodule.  As a result, Radiology  recommended follow-up CT in 6 months.  Given the diagnosis of pulmonary  embolism, he was initiated on heparin and Coumadin therapy and  subsequently underwent a lower extremity ultrasound on December 10th  which did, in fact, reveal a  left lower extremity deep vein thrombosis  extending from the proximal to mid femoral vein, approximately 5-6  inches from the groin area.  His INR became therapeutic at 2.5 on  December 10th and, due to the extensive nature of his clots, we kept him  on heparin for an additional 48 hours to overlap his Coumadin.  Arrangements have been made for Coumadin Clinic follow-up in our [facility]  Cardiology Coumadin Clinic and he is being discharged home today in  satisfactory condition.   DISCHARGE LABS:  Hemoglobin 15.7, hematocrit 45, WBCs 6.3, platelets  174,000, MCV 9.5, sodium 140, potassium 3.6, chloride 106, CO2 26, BUN  8, creatinine 0.9, glucose 100.  The PT 33.5, INR 3.1, heparin 0.41.  Cardiac enzymes negative times 2.  Total cholesterol 118, triglycerides  105, HDL 29, LDL 68, calcium 8.5.  The TSH 1.895. Cardiolipin antibody  is pending.  Prothrombin gene mutation is pending.  Homocystine was  11.6.  Lupus anticoagulant is pending.  The Factor V Leiden was negative  for Factor V mutation. The D-dimer was 5.10.   DISPOSITION:  The patient is being discharged home today in good  condition.   FOLLOW-UP PLANS AND APPOINTMENTS:  The patient is advised to obtain a  primary care physician.  He has follow-up with Dr. Prescott Gum PA or  nurse practitioner on December 28th at 10:15 a.m. for groin check  following recent catheterization.  He also has a follow-up at [facility]  Cardiology Coumadin Clinic on December 13th at 1:30 p.m.   DISCHARGE MEDICATIONS:  1. Aspirin 81 mg daily.  2. Coumadin 5 mg q.h.s.   OUTSTANDING LAB STUDIES:  None.   Patient has been counseled on the importance of smoking cessation and  says that he has officially now quit.   DURATION OF DISCHARGE ENCOUNTER:  45 minutes including physician time.      Nicolasa Ducking, ANP      Gerrit Friends. Dietrich Pates, MD, [facility]  Electronically Signed    CB/MEDQ  D:  09/21/2006  T:  09/21/2006  Job:  [id number]

## 2011-02-27 NOTE — Assessment & Plan Note (Signed)
[facility]                            CARDIOLOGY OFFICE NOTE   KELLYN, MANSFIELD                          MRN:          [id number]  DATE:10/08/2006                            DOB:          1941-09-27    Primary cardiologist Dr. Arvilla Meres.  The patient does not have a  primary care physician.   PATIENT PROFILE:  A 70 year old white male recently hospitalized for  chest pain and shortness of breath, who was found to have a bilateral  pulmonary embolism with left lower extremity DVT.   PROBLEM:  1. Bilateral pulmonary embolism, diagnosed December 2007.  2. Left lower extremity DVT, diagnosed December 2007.  3. Nonobstructive coronary artery disease by cardiac catheterization,      September 17, 2006, EF 60%.  4. Left lower lobe pulmonary nodule requiring followup CT in May of      2008.  5. COPD.  6. Ongoing tobacco abuse.  Currently smoking 3/4 of a pack daily, down      from 2 packs a day.   ALLERGIES:  NO KNOWN DRUG ALLERGIES.   HISTORY OF PRESENT ILLNESS:  A 70 year old Caucasian male with no prior  history of coronary artery disease, who presented to [facility]  September 17, 2006, with a 5-day history of exertional angina.  Cardiac  catheterization revealed nonobstructive disease with normal LV function.  He was found to have an elevated D-dimer of 5.10, and subsequently  underwent CT of the chest which showed bilateral pulmonary embolism, as  well as a small left lower lobe pulmonary nodule.  Further, a bilateral  lower extremity ultrasound revealed a left lower extremity DVT.  He was  placed on Coumadin and heparin during hospitalization with 48-hour  overlap, and was subsequently discharged home, September 21, 2006.  Since  discharge, he, unfortunately, continues to smoke about 3/4 of a pack a  day.  He has been followed in Coumadin clinic and has been tolerating  the medication well without any evidence of GI bleeding.  He  notes that  if he is on higher dose Coumadin (5 mg) for several days in a row, that  he does sometimes have mildly blood-tinged nasal drainage.  He is now  back to work and says that he will stop every 90 minutes or so to walk  outside the truck.   CURRENT MEDICATIONS:  1. Coumadin as directed.  2. Aspirin 81 mg daily.   PHYSICAL EXAMINATION:  Blood pressure 132/89, heart rate 71,  respirations 18.  He is afebrile.  Weight is 216 pounds.  Pleasant white  male in no acute distress.  Awake, alert and oriented x3.  NECK:  Normal carotid upstrokes.  No bruits or JVD.  LUNGS:  Respirations regular and unlabored, clear to auscultation.  CARDIAC:  Regular S1, S2.  No S3, S4 or murmurs.  ABDOMEN:  Round, soft, nontender, nondistended.  Bowel sounds present  x4.  EXTREMITIES:  Warm and dry, pink, no clubbing or cyanosis with 1+ left  lower extremity edema.  Dorsalis pedis, posterior tibial pulses 1+ and  equal  bilaterally.  ACCESSORY CLINICAL FINDINGS:  None.   ASSESSMENT/PLAN:  1. Bilateral pulmonary embolism/left lower extremity deep venous      thrombosis.  Patient is currently on Coumadin and is followed in      the Coumadin clinic.  He is advised to obtain a primary care      physician.  He does have some mild swelling in his left lower      extremity, he says, which is worse with left lower extremity      dependence and improved by keeping his legs elevated.  2. Ongoing tobacco abuse.  Patient is advised to quit.  I offered him      Chantix; however, he said his daughter just gave him a gift card      for the Nicoderm patch, and he wants to try that first.  I advised      that it is always an option to use Chantix sometime down the road.  3. Chronic obstructive pulmonary disease.  Smoking cessation advised.      He is not currently wheezing, and he is also advised to obtain a      primary care physician.  4. Left lower lobe pulmonary nodule.  This will require repeat CT in      May  of 2008.   DISPOSITION:  Patient advised to obtain primary care.  Follow up in  approximately 3 months.  F/U in Coumadin clinic as scheduled.      Nicolasa Ducking, ANP  Electronically Signed      Everardo Beals. Juanda Chance, MD, [facility]  Electronically Signed   CB/MedQ  DD: 10/08/2006  DT: 10/08/2006  Job #: [id number]

## 2011-02-27 NOTE — H&P (Signed)
NAME:  Jake Richmond, Jake Richmond NO.:  [id number]   MEDICAL RECORD NO.:  [id number]          PATIENT TYPE:  INP   LOCATION:  1823                         FACILITY:  [facility]   PHYSICIAN:  Gerrit Friends. Dietrich Pates, MD, FACCDATE OF BIRTH:  1941-07-12   DATE OF ADMISSION:  09/17/2006  DATE OF DISCHARGE:                              HISTORY & PHYSICAL   PRIMARY CARE PHYSICIAN:  None.   HISTORY OF PRESENT ILLNESS:  This is a very pleasant 70 year old obese  Caucasian male who presented to the emergency room today after five days  of exertional chest pain. The patient began to feel chest pain  approximately four days ago while he was outside working in the cold air  which he described as a burning in his throat and a burning in his  chest. The pain lasted approximately 20 minutes and went away with rest.  He did have associated shortness of breath. He denies any syncope,  presyncope, or diaphoresis associated.   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 He states that began to feel more like tightness  on this second episode, like a band around his chest. It occurs  approximately 10 to 20 minutes and will go away when he rests. The  patient was able to be more predictable on his chest discomfort because  each time he exerted himself minimally the patient began to have  recurrent chest pain. It would always go away with rest. He also states  that with each episode his breathing became more worrisome, and he was  having more trouble taking deep breaths. The patient on this a.m. with  the last several days of recurrent chest discomfort went to [facility] and was sent to the emergency room for further evaluation.  The patient also admits to driving approximately 8 hours to 12 hours a  day, and he does take frequent rest stops every 3 to 4 hours.   REVIEW OF SYSTEMS:  As above. Positive for chest pain, shortness of  breath, dyspnea on exertion, lightheadedness with dyspnea, and mild  nausea. Otherwise negative.   PAST MEDICAL HISTORY:  None.   PAST SURGICAL HISTORY:  None.   SOCIAL HISTORY:  The patient lives in [place] alone. He is separated.  He has two boys and one girl. 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 He is a 65-pack-year smoker with 1-1/2 packs of cigarettes  a day. Negative for ETOH, drug use, herbal medicine use. He has very  little exercise and is not on a special diet.   FAMILY HISTORY:  Mother died at age 47 of old age. Father died of lung  disease from gas exposure during World War II. He has three sisters and  two brothers; one sister died of cancer at age 44.   CARDIOVASCULAR RISKS FACTORS:  Obesity and tobacco abuse.   PHYSICAL EXAMINATION:  On evaluation, blood pressure 121/92, pulse 107,  respirations  28, temperature 97.4, O2 saturation 93% on room air.  GENERAL:  This is a very pleasant, 70 year old obese Caucasian male in  no acute distress resting comfortable on a stretcher.  HEENT:  Head is normocephalic, atraumatic. The patient does wear  glasses. Mucous membranes are pink and moist. Tongue is midline.  NECK:  Supple. There is no JVD. No carotid bruits are appreciated. No  thyromegaly is noted.  CARDIOVASCULAR:  Regular rate and rhythm. Tachycardic without murmurs,  rubs or gallops. Radial pulses and dorsalis pedis pulses are 2+  bilaterally. There are no bruits appreciated.  LUNGS:  Essentially clear to auscultation without wheezes, rales or  rhonchi.  SKIN:  There is no rash or lesions.  ABDOMEN:  Obese, nontender, with 2+ bowel sounds.  EXTREMITIES:  Without clubbing, cyanosis. There is mild left ankle edema  noted.  NEUROLOGICAL:  Intact.   Chest x-ray reveals cardiomegaly without edema, central airway thickened  consistent with bronchitis and reactive airways disease.  EKG reveals  sinus tachycardia, ventricular rate of 105 beats per minute. There are  occasional PVCs. 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   LABORATORY DATA:  Hemoglobin 17.1, hematocrit 50.5, white blood cells  10.3, platelets 169. Sodium 140, potassium 4.0, chloride 107, CO2 22,  BUN 14, creatinine 1.0, glucose 110. Point of care markers:  Troponin  0.05, CK-MB 1.8, myoglobin 72.8.   IMPRESSION:  1. Chest pain, rule out acute coronary syndrome.  2. Obesity.  3. Chest x-ray revealing bronchitis.  4. Rule out pulmonary embolism; will check D-dimer secondary to long      trips as a truck driver.  5. Tobacco abuse.   PLAN:  This is a 70 year old obese Caucasian male who presented to the  emergency room with exertional angina for 4 days with EKG revealing  prior MI. The patient has been seen and examined by Dr. Dietrich Pates, and  plans for cardiac catheterization have been discussed. The patient  verbalized understanding and agrees to proceed. Cardiac catheterization  will be completed today on September 17, 2006 with further recommendations  based upon results.      Bettey Mare. Lyman Bishop, NP      Gerrit Friends. Dietrich Pates, MD, [facility]  Electronically Signed    KML/MEDQ  D:  09/17/2006  T:  09/17/2006  Job:  [id number]

## 2011-02-27 NOTE — Cardiovascular Report (Signed)
NAME:  Jake Richmond, Jake Richmond NO.:  [id number]   MEDICAL RECORD NO.:  [id number]          PATIENT TYPE:  INP   LOCATION:  2807                         FACILITY:  [facility]   PHYSICIAN:  Veverly Fells. Excell Seltzer, MD  DATE OF BIRTH:  06-14-1941   DATE OF PROCEDURE:  09/17/2006  DATE OF DISCHARGE:                            CARDIAC CATHETERIZATION   PROCEDURE:  Left heart catheterization, selective coronary angiography,  left ventricular angiography, AngioSeal right femoral artery.   INDICATIONS:  Jake Richmond is a 70 year old male previously healthy who  presented to the emergency department with classic exertional dyspnea  and chest discomfort.  He does not complain of resting symptoms.  He is  a long time smoker and has multiple cardiac risk factors but has not  seen a doctor in over 30 years.  He was referred for cardiac  catheterization in the setting of his classic symptoms.   DESCRIPTION OF PROCEDURE:  The procedural details, risks and indications  of the procedure were explained in detail to the patient.  Informed  consent was obtained.  The right groin was prepped, draped, and  anesthetized with 1% lidocaine.  Using a front wall arterial puncture, a  6-French sheath was placed in the right femoral artery.  Multiple views  of the left and right coronary arteries were taken with a JL-4 and JR-4  catheter respectively.  Following coronary angiography, an angled  pigtail catheter was inserted into the left ventricle and pressures were  recorded.  A left ventriculogram was done.  A pullback across the aortic  valve was performed.  Following the procedure, an Angio-Seal was placed  in the right femoral artery for arterial closure.   FINDINGS:  Aortic pressure 96/74 with a mean of 83, left ventricular  pressure 99/3 with an end diastolic pressure of 7.   CORONARY ANGIOGRAPHY:  The left main stem is angiographically normal.  It bifurcates into the LAD and left circumflex.   The  LAD is a large caliber vessel that courses down the anterior wall to  the left ventricular apex.  It gives off a large first diagonal branch.  The LAD and diagonal are angiographically normal.   The left circumflex is a very large caliber vessel.  It gives off a  small first obtuse marginal branch.  Distally, it gives off a branching  vessel that runs the course of an OM2 and OM3.  The distal circumflex  and left PDA are fairly small diameter.  The left circumflex system is  angiographically normal.  The left circumflex is dominant.   The right coronary artery is medium caliber and is nondominant.  It  gives off a large acute marginal branch.  The right coronary artery is  angiographically normal.   Left ventricular function is normal.  The LVEF is estimated at 60%.  There is no significant mitral regurgitation.   ASSESSMENT:  1. Normal coronary arteries.  2. Normal left ventricular function.   PLAN:  Jake Richmond has unexplained dyspnea and chest pain with exertion.  During his diagnostic catheterization procedure, he was mildly hypoxemic  with  oxygen saturations the high 80s.  He was also slightly tachycardiac  with a heart rate between 100 and 105.  As we have a high clinical  suspicion, at this point, for pulmonary embolus, we have elected to send  him down for stat CAT scan of the chest to rule out PE.  He drives a  truck for living and he denies any recent swelling in either of his  legs.  However, he is clearly at risk for PE and after finding that he  has normal coronary arteries, this seems the most likely etiology of his  symptoms at this point.  70 mL of contrast were used for the diagnostic  catheterization and, in the setting of his normal renal function, he  should tolerate another dye load without difficulty.  We will make sure  that he is well hydrated following the CT scan.  Further plans pending  the results of the CT. dyspnea and chest pain with exertion.  During his diagnostic catheterization procedure, he was mildly hypoxemic  with  oxygen saturations the high 80s.  He was also slightly tachycardiac  with a heart rate between 100 and 105.  As we have a high clinical  suspicion, at this point, for pulmonary embolus, we have elected to send  him down for stat CAT scan of the chest to rule out PE.  He drives a  truck for living and he denies any recent swelling in either of his  legs.  However, he is clearly at risk for PE and after finding that he  has normal coronary arteries, this seems the most likely etiology of his  symptoms at this point.  70 mL of contrast were used for the diagnostic  catheterization and, in the setting of his normal renal function, he  should tolerate another dye load without difficulty.  We will make sure  that he is well hydrated following the CT scan.  Further plans pending  the results of the CT.      Veverly Fells. Excell Seltzer, MD  Electronically  Signed     MDC/MEDQ  D:  09/17/2006  T:  09/18/2006  Job:  [id number]

## 2011-02-27 NOTE — Assessment & Plan Note (Signed)
[facility]                            CARDIOLOGY OFFICE NOTE   LINDSEY, HOMMEL                          MRN:          [id number]  DATE:12/24/2006                            DOB:          [birth year]    PRIMARY CARE PHYSICIAN:  None.   INTERVAL HISTORY:  Mr. 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 He had a cardiac catheterization  back in December which showed a normal coronary arteries with an  ejection fraction of 60%. Also had a CT at that time which showed aside  from the PE s showed a left lower lobe pulmonary nodule for which he is  due for follow up.   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 He denies any  chest pain or shortness of breath. He had some lower extremity edema but  this has resolved. He has been following his blood pressure and it has  been in the 130 to 138 range.   CURRENT MEDICATIONS:  Include;  1. Aspirin 81.  2. Coumadin.  3. Simvastatin 40.   PHYSICAL EXAMINATION:  He is well-appearing, no acute distress. Blood  pressure is 146/90, heart rate is 80, his weight is 220 pounds.  HEENT: Sclera anicteric. EOMI: There is no xanthelasma, mucous membranes  are moist. Poor dentition.  NECK: Supple. JVP is about 8 cm of water with prominent CV waves. No  lymphadenopathy or thyromegaly.  CARDIAC: Regular rate and rhythm, no murmurs, rubs, or gallops.  LUNGS: Clear, with a mildly prolonged expiratory phase.  ABDOMEN: Obese, nontender, nondistended. No hepatosplenomegaly, no  bruits. No masses, good bowel sounds.  EXTREMITIES: Warm with no cyanosis, or clubbing. There is trace edema  with some very mild macular rash.  NEURO: He is alert and oriented  x3, cranial nerves II-XII are intact,  moves all 4 extremities without difficultly.   ASSESSMENT/PLAN:  1. Pulmonary embolus, he is doing well. He has been maintained on      Coumadin. I suspect he will need life long therapy.  2. Hypertension, blood pressure suboptimally controlled, given his      mild volume overload I think it is reasonable to start him on a      diuretic. We will hydrochlorothiazide 25 with potassium 20. He will      get a follow up BMET next week.  3. Hyperlipidemia, will check his lipids next week.  4. Pulmonary nodule, he is due for a CT scan of the chest, we will      order this.  5. Tobacco use, he has been counseled to stop smoking. He has nicotine      gum but has not been using this. We did discuss Chantix briefly.     Bevelyn Buckles. Bensimhon, MD  Electronically Signed  DRB/MedQ  DD: 12/24/2006  DT: 12/25/2006  Job #: [id number]

## 2011-03-18 ENCOUNTER — Ambulatory Visit (INDEPENDENT_AMBULATORY_CARE_PROVIDER_SITE_OTHER): Payer: Medicare Other | Admitting: *Deleted

## 2011-03-18 DIAGNOSIS — I2699 Other pulmonary embolism without acute cor pulmonale: Secondary | ICD-10-CM

## 2011-03-18 LAB — POCT INR: INR: 2.6

## 2011-04-16 ENCOUNTER — Ambulatory Visit (INDEPENDENT_AMBULATORY_CARE_PROVIDER_SITE_OTHER): Payer: Medicare Other | Admitting: *Deleted

## 2011-04-16 DIAGNOSIS — I2699 Other pulmonary embolism without acute cor pulmonale: Secondary | ICD-10-CM

## 2011-04-16 LAB — POCT INR: INR: 2.6

## 2011-05-14 ENCOUNTER — Ambulatory Visit (INDEPENDENT_AMBULATORY_CARE_PROVIDER_SITE_OTHER): Payer: Medicare Other | Admitting: *Deleted

## 2011-05-14 DIAGNOSIS — I2699 Other pulmonary embolism without acute cor pulmonale: Secondary | ICD-10-CM

## 2011-05-14 LAB — POCT INR: INR: 2.2

## 2011-06-18 ENCOUNTER — Encounter: Payer: Medicare Other | Admitting: *Deleted

## 2011-06-23 ENCOUNTER — Ambulatory Visit (INDEPENDENT_AMBULATORY_CARE_PROVIDER_SITE_OTHER): Payer: Medicare Other | Admitting: *Deleted

## 2011-06-23 DIAGNOSIS — I2699 Other pulmonary embolism without acute cor pulmonale: Secondary | ICD-10-CM

## 2011-06-23 LAB — POCT INR: INR: 2.7

## 2011-07-07 LAB — BLOOD GAS, ARTERIAL
Acid-Base Excess: 2.1 — ABNORMAL HIGH
Bicarbonate: 25.7 — ABNORMAL HIGH
Drawn by: 242311
TCO2: 26.9
pO2, Arterial: 75.2 — ABNORMAL LOW

## 2011-07-21 ENCOUNTER — Ambulatory Visit (INDEPENDENT_AMBULATORY_CARE_PROVIDER_SITE_OTHER): Payer: Medicare Other | Admitting: *Deleted

## 2011-07-21 DIAGNOSIS — I2699 Other pulmonary embolism without acute cor pulmonale: Secondary | ICD-10-CM

## 2011-07-21 LAB — POCT INR: INR: 2.7

## 2011-08-18 ENCOUNTER — Ambulatory Visit (INDEPENDENT_AMBULATORY_CARE_PROVIDER_SITE_OTHER): Payer: Medicare Other | Admitting: *Deleted

## 2011-08-18 DIAGNOSIS — I2699 Other pulmonary embolism without acute cor pulmonale: Secondary | ICD-10-CM

## 2011-08-18 LAB — POCT INR: INR: 2.4

## 2011-08-18 MED ORDER — WARFARIN SODIUM 5 MG PO TABS: ORAL_TABLET | ORAL | Status: DC

## 2011-09-29 ENCOUNTER — Ambulatory Visit (INDEPENDENT_AMBULATORY_CARE_PROVIDER_SITE_OTHER): Payer: Medicare Other | Admitting: *Deleted

## 2011-09-29 DIAGNOSIS — I2699 Other pulmonary embolism without acute cor pulmonale: Secondary | ICD-10-CM

## 2011-09-29 LAB — POCT INR: INR: 2.7

## 2011-11-10 ENCOUNTER — Ambulatory Visit (INDEPENDENT_AMBULATORY_CARE_PROVIDER_SITE_OTHER): Payer: Medicare Other | Admitting: *Deleted

## 2011-11-10 DIAGNOSIS — Z7901 Long term (current) use of anticoagulants: Secondary | ICD-10-CM

## 2011-11-10 DIAGNOSIS — I2699 Other pulmonary embolism without acute cor pulmonale: Secondary | ICD-10-CM

## 2011-11-10 LAB — POCT INR: INR: 3.1

## 2011-12-22 ENCOUNTER — Ambulatory Visit (INDEPENDENT_AMBULATORY_CARE_PROVIDER_SITE_OTHER): Payer: Medicare Other | Admitting: *Deleted

## 2011-12-22 DIAGNOSIS — I2699 Other pulmonary embolism without acute cor pulmonale: Secondary | ICD-10-CM

## 2011-12-22 DIAGNOSIS — Z7901 Long term (current) use of anticoagulants: Secondary | ICD-10-CM

## 2012-02-02 ENCOUNTER — Ambulatory Visit (INDEPENDENT_AMBULATORY_CARE_PROVIDER_SITE_OTHER): Payer: Medicare Other | Admitting: *Deleted

## 2012-02-02 DIAGNOSIS — I2699 Other pulmonary embolism without acute cor pulmonale: Secondary | ICD-10-CM

## 2012-02-02 DIAGNOSIS — Z7901 Long term (current) use of anticoagulants: Secondary | ICD-10-CM

## 2012-02-02 LAB — POCT INR
INR: 1.2
INR: 1.2

## 2012-02-17 ENCOUNTER — Ambulatory Visit (INDEPENDENT_AMBULATORY_CARE_PROVIDER_SITE_OTHER): Payer: Medicare Other | Admitting: Pharmacist

## 2012-02-17 DIAGNOSIS — I2699 Other pulmonary embolism without acute cor pulmonale: Secondary | ICD-10-CM

## 2012-02-17 DIAGNOSIS — Z7901 Long term (current) use of anticoagulants: Secondary | ICD-10-CM

## 2012-02-17 LAB — POCT INR: INR: 2.2

## 2012-03-30 ENCOUNTER — Ambulatory Visit (INDEPENDENT_AMBULATORY_CARE_PROVIDER_SITE_OTHER): Payer: Medicare Other | Admitting: *Deleted

## 2012-03-30 DIAGNOSIS — Z7901 Long term (current) use of anticoagulants: Secondary | ICD-10-CM

## 2012-03-30 DIAGNOSIS — I2699 Other pulmonary embolism without acute cor pulmonale: Secondary | ICD-10-CM

## 2012-04-20 ENCOUNTER — Ambulatory Visit (INDEPENDENT_AMBULATORY_CARE_PROVIDER_SITE_OTHER): Payer: Medicare Other | Admitting: *Deleted

## 2012-04-20 DIAGNOSIS — Z7901 Long term (current) use of anticoagulants: Secondary | ICD-10-CM

## 2012-04-20 DIAGNOSIS — I2699 Other pulmonary embolism without acute cor pulmonale: Secondary | ICD-10-CM

## 2012-04-20 LAB — POCT INR: INR: 2.4

## 2012-04-20 MED ORDER — WARFARIN SODIUM 5 MG PO TABS: ORAL_TABLET | ORAL | Status: DC

## 2012-05-18 ENCOUNTER — Ambulatory Visit (INDEPENDENT_AMBULATORY_CARE_PROVIDER_SITE_OTHER): Payer: Medicare Other | Admitting: *Deleted

## 2012-05-18 DIAGNOSIS — I2699 Other pulmonary embolism without acute cor pulmonale: Secondary | ICD-10-CM

## 2012-05-18 DIAGNOSIS — Z7901 Long term (current) use of anticoagulants: Secondary | ICD-10-CM

## 2012-06-15 ENCOUNTER — Ambulatory Visit (INDEPENDENT_AMBULATORY_CARE_PROVIDER_SITE_OTHER): Payer: Medicare Other | Admitting: *Deleted

## 2012-06-15 DIAGNOSIS — I2699 Other pulmonary embolism without acute cor pulmonale: Secondary | ICD-10-CM

## 2012-06-15 DIAGNOSIS — Z7901 Long term (current) use of anticoagulants: Secondary | ICD-10-CM

## 2012-07-04 ENCOUNTER — Ambulatory Visit (INDEPENDENT_AMBULATORY_CARE_PROVIDER_SITE_OTHER): Payer: Medicare Other | Admitting: *Deleted

## 2012-07-04 DIAGNOSIS — I2699 Other pulmonary embolism without acute cor pulmonale: Secondary | ICD-10-CM

## 2012-07-04 DIAGNOSIS — Z7901 Long term (current) use of anticoagulants: Secondary | ICD-10-CM

## 2012-07-04 LAB — POCT INR: INR: 2.6

## 2012-08-01 ENCOUNTER — Ambulatory Visit (INDEPENDENT_AMBULATORY_CARE_PROVIDER_SITE_OTHER): Payer: Medicare Other | Admitting: *Deleted

## 2012-08-01 DIAGNOSIS — I2699 Other pulmonary embolism without acute cor pulmonale: Secondary | ICD-10-CM

## 2012-08-01 DIAGNOSIS — Z7901 Long term (current) use of anticoagulants: Secondary | ICD-10-CM

## 2012-08-01 LAB — POCT INR: INR: 3.1

## 2012-08-29 ENCOUNTER — Ambulatory Visit (INDEPENDENT_AMBULATORY_CARE_PROVIDER_SITE_OTHER): Payer: Medicare Other

## 2012-08-29 DIAGNOSIS — Z7901 Long term (current) use of anticoagulants: Secondary | ICD-10-CM

## 2012-08-29 DIAGNOSIS — I2699 Other pulmonary embolism without acute cor pulmonale: Secondary | ICD-10-CM

## 2012-08-29 LAB — POCT INR: INR: 2.8

## 2012-09-27 ENCOUNTER — Ambulatory Visit (INDEPENDENT_AMBULATORY_CARE_PROVIDER_SITE_OTHER): Payer: Medicare Other | Admitting: *Deleted

## 2012-09-27 DIAGNOSIS — I2699 Other pulmonary embolism without acute cor pulmonale: Secondary | ICD-10-CM

## 2012-09-27 DIAGNOSIS — Z7901 Long term (current) use of anticoagulants: Secondary | ICD-10-CM

## 2012-09-27 LAB — POCT INR: INR: 2.8

## 2012-10-26 ENCOUNTER — Ambulatory Visit (INDEPENDENT_AMBULATORY_CARE_PROVIDER_SITE_OTHER): Payer: Medicare Other | Admitting: *Deleted

## 2012-10-26 DIAGNOSIS — I2699 Other pulmonary embolism without acute cor pulmonale: Secondary | ICD-10-CM

## 2012-10-26 DIAGNOSIS — Z7901 Long term (current) use of anticoagulants: Secondary | ICD-10-CM

## 2012-10-26 LAB — POCT INR: INR: 2.5

## 2012-12-07 ENCOUNTER — Ambulatory Visit (INDEPENDENT_AMBULATORY_CARE_PROVIDER_SITE_OTHER): Payer: Medicare Other | Admitting: *Deleted

## 2012-12-07 DIAGNOSIS — Z7901 Long term (current) use of anticoagulants: Secondary | ICD-10-CM

## 2012-12-07 DIAGNOSIS — I2699 Other pulmonary embolism without acute cor pulmonale: Secondary | ICD-10-CM

## 2013-01-18 ENCOUNTER — Ambulatory Visit (INDEPENDENT_AMBULATORY_CARE_PROVIDER_SITE_OTHER): Payer: Medicare Other | Admitting: *Deleted

## 2013-01-18 DIAGNOSIS — I2699 Other pulmonary embolism without acute cor pulmonale: Secondary | ICD-10-CM

## 2013-01-18 DIAGNOSIS — Z7901 Long term (current) use of anticoagulants: Secondary | ICD-10-CM

## 2013-02-15 ENCOUNTER — Ambulatory Visit (INDEPENDENT_AMBULATORY_CARE_PROVIDER_SITE_OTHER): Payer: Medicare Other | Admitting: *Deleted

## 2013-02-15 DIAGNOSIS — I2699 Other pulmonary embolism without acute cor pulmonale: Secondary | ICD-10-CM

## 2013-02-15 DIAGNOSIS — Z7901 Long term (current) use of anticoagulants: Secondary | ICD-10-CM

## 2013-03-07 ENCOUNTER — Telehealth: Payer: Self-pay | Admitting: *Deleted

## 2013-03-07 NOTE — Telephone Encounter (Signed)
Pt needs a refill on his Warfarin

## 2013-03-08 MED ORDER — WARFARIN SODIUM 5 MG PO TABS: ORAL_TABLET | ORAL | Status: DC

## 2013-03-08 NOTE — Telephone Encounter (Signed)
Rx refill sent to pharmacy as requested. 

## 2013-03-15 ENCOUNTER — Ambulatory Visit (INDEPENDENT_AMBULATORY_CARE_PROVIDER_SITE_OTHER): Payer: Medicare Other | Admitting: *Deleted

## 2013-03-15 DIAGNOSIS — Z7901 Long term (current) use of anticoagulants: Secondary | ICD-10-CM

## 2013-03-15 DIAGNOSIS — I2699 Other pulmonary embolism without acute cor pulmonale: Secondary | ICD-10-CM

## 2013-04-19 ENCOUNTER — Ambulatory Visit (INDEPENDENT_AMBULATORY_CARE_PROVIDER_SITE_OTHER): Payer: Medicare Other | Admitting: *Deleted

## 2013-04-19 DIAGNOSIS — Z7901 Long term (current) use of anticoagulants: Secondary | ICD-10-CM

## 2013-04-19 DIAGNOSIS — I2699 Other pulmonary embolism without acute cor pulmonale: Secondary | ICD-10-CM

## 2013-05-31 ENCOUNTER — Ambulatory Visit (INDEPENDENT_AMBULATORY_CARE_PROVIDER_SITE_OTHER): Payer: Medicare Other | Admitting: *Deleted

## 2013-05-31 DIAGNOSIS — Z7901 Long term (current) use of anticoagulants: Secondary | ICD-10-CM

## 2013-05-31 DIAGNOSIS — I2699 Other pulmonary embolism without acute cor pulmonale: Secondary | ICD-10-CM

## 2013-06-21 ENCOUNTER — Ambulatory Visit (INDEPENDENT_AMBULATORY_CARE_PROVIDER_SITE_OTHER): Payer: Medicare Other | Admitting: *Deleted

## 2013-06-21 DIAGNOSIS — Z7901 Long term (current) use of anticoagulants: Secondary | ICD-10-CM

## 2013-06-21 DIAGNOSIS — I2699 Other pulmonary embolism without acute cor pulmonale: Secondary | ICD-10-CM

## 2013-06-21 LAB — POCT INR: INR: 1.8

## 2013-07-12 ENCOUNTER — Ambulatory Visit (INDEPENDENT_AMBULATORY_CARE_PROVIDER_SITE_OTHER): Payer: Medicare Other | Admitting: *Deleted

## 2013-07-12 DIAGNOSIS — I2699 Other pulmonary embolism without acute cor pulmonale: Secondary | ICD-10-CM

## 2013-07-12 DIAGNOSIS — Z7901 Long term (current) use of anticoagulants: Secondary | ICD-10-CM

## 2013-08-07 ENCOUNTER — Ambulatory Visit (INDEPENDENT_AMBULATORY_CARE_PROVIDER_SITE_OTHER): Payer: Medicare Other | Admitting: Pharmacist

## 2013-08-07 DIAGNOSIS — I2699 Other pulmonary embolism without acute cor pulmonale: Secondary | ICD-10-CM

## 2013-08-07 DIAGNOSIS — Z7901 Long term (current) use of anticoagulants: Secondary | ICD-10-CM

## 2013-08-29 ENCOUNTER — Ambulatory Visit (INDEPENDENT_AMBULATORY_CARE_PROVIDER_SITE_OTHER): Payer: Medicare Other | Admitting: Cardiovascular Disease

## 2013-08-29 ENCOUNTER — Telehealth: Payer: Self-pay | Admitting: Pharmacist

## 2013-08-29 ENCOUNTER — Ambulatory Visit (INDEPENDENT_AMBULATORY_CARE_PROVIDER_SITE_OTHER): Payer: Medicare Other | Admitting: *Deleted

## 2013-08-29 ENCOUNTER — Encounter: Payer: Self-pay | Admitting: Cardiovascular Disease

## 2013-08-29 VITALS — BP 152/82 | HR 84 | Ht 70.0 in | Wt 212.8 lb

## 2013-08-29 DIAGNOSIS — F17200 Nicotine dependence, unspecified, uncomplicated: Secondary | ICD-10-CM

## 2013-08-29 DIAGNOSIS — I2699 Other pulmonary embolism without acute cor pulmonale: Secondary | ICD-10-CM

## 2013-08-29 DIAGNOSIS — R079 Chest pain, unspecified: Secondary | ICD-10-CM | POA: Insufficient documentation

## 2013-08-29 DIAGNOSIS — Z720 Tobacco use: Secondary | ICD-10-CM | POA: Insufficient documentation

## 2013-08-29 DIAGNOSIS — E669 Obesity, unspecified: Secondary | ICD-10-CM | POA: Insufficient documentation

## 2013-08-29 DIAGNOSIS — R030 Elevated blood-pressure reading, without diagnosis of hypertension: Secondary | ICD-10-CM

## 2013-08-29 DIAGNOSIS — R0602 Shortness of breath: Secondary | ICD-10-CM | POA: Insufficient documentation

## 2013-08-29 DIAGNOSIS — Z7901 Long term (current) use of anticoagulants: Secondary | ICD-10-CM

## 2013-08-29 LAB — POCT INR: INR: 3.7

## 2013-08-29 MED ORDER — VARENICLINE TARTRATE 1 MG PO TABS: 1.0000 mg | ORAL_TABLET | Freq: Two times a day (BID) | ORAL | Status: DC

## 2013-08-29 MED ORDER — VARENICLINE TARTRATE 0.5 MG X 11 & 1 MG X 42 PO MISC: ORAL | Status: DC

## 2013-08-29 NOTE — Assessment & Plan Note (Signed)
We discussed the importance of smoking cessation. He also had consultation with our pharmacist and agreed to start Chantix.

## 2013-08-29 NOTE — Assessment & Plan Note (Addendum)
He continues to be on long-term anticoagulation with warfarin without complications. The initial episode he had in 2007 happened after a prolonged immobility and driving. There has been discussion in the past with Dr. Gala Romney about the length of anticoagulation therapy. 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 Once he retires, anticoagulation can be discontinued. Continue aspirin indefinitely. Check routine labs including CBC and BMP.

## 2013-08-29 NOTE — Addendum Note (Signed)
Addended by: Kem Parkinson on: 08/29/2013 11:00 AM   Modules accepted: Orders

## 2013-08-29 NOTE — Telephone Encounter (Signed)
Discussed Chantix use patient today in clinic as Dr. Kirke Corin was okay with patient starting this today.  He is aware to smoke normally for the first week, then stop, and to follow dosing instruction of dose packs. He will use Chantix for 3 month duration.  He was warned nausea was most common side effect, and warned patient also of possibility of vivid dreams at night.  Patient shows verbal understanding.  Prescription sent into [facility] on [address].   To Dr. Kirke Corin as Lorain Childes.

## 2013-08-29 NOTE — Progress Notes (Signed)
   HPI  This is a 72 year old man who is here today for a followup visit. He has known history of bilateral pulmonary embolism in 2007 and has been on anticoagulation since then. The episode was triggered by prolonged driving. He had cardiac catheterization done reviewing his initial evaluation in 2007 which showed normal coronary arteries with normal LV systolic function. He has prolonged history of tobacco use. There is no history of hypertension or diabetes.  he does not have a primary care physician. He has been doing very well and denies any chest pain, dyspnea or palpitations. No lower extremity claudication. He is planning to move to [facility] within the next 6 months.  Not on File   Current Outpatient Prescriptions on File Prior to Visit  Medication Sig Dispense Refill  . warfarin (COUMADIN) 5 MG tablet Take as directed by anticoagulation clinic  90 tablet  1   No current facility-administered medications on file prior to visit.     Past Medical History  Diagnosis Date  . Chest pain   . Obesity   . SOB (shortness of breath)      No past surgical history on file.   No family history on file.   History   Social History  . Marital Status: Legally Separated    Spouse Name: N/A    Number of Children: N/A  . Years of Education: N/A   Occupational History  . Not on file.   Social History Main Topics  . Smoking status: Current Every Day Smoker  . Smokeless tobacco: Not on file  . Alcohol Use: Not on file  . Drug Use: Not on file  . Sexual Activity: Not on file   Other Topics Concern  . Not on file   Social History Narrative  . No narrative on file     PHYSICAL EXAM   BP 152/82  Pulse 84  Ht 5\' 10"  (1.778 m)  Wt 212 lb 12.8 oz (96.525 kg)  BMI 30.53 kg/m2 Constitutional: He is oriented to person, place, and time. He appears well-developed and well-nourished. No distress.  HENT: No nasal discharge.  Head: Normocephalic and atraumatic.  Eyes: Pupils  are equal and round.  No discharge. Neck: Normal range of motion. Neck supple. No JVD present. No thyromegaly present.  Cardiovascular: Normal rate, regular rhythm, normal heart sounds. Exam reveals no gallop and no friction rub. No murmur heard.  Pulmonary/Chest: Effort normal and breath sounds normal. No stridor. No respiratory distress. He has no wheezes. He has no rales. He exhibits no tenderness.  Abdominal: Soft. Bowel sounds are normal. He exhibits no distension. There is no tenderness. There is no rebound and no guarding.  Musculoskeletal: Normal range of motion. He exhibits no edema and no tenderness.  Neurological: He is alert and oriented to person, place, and time. Coordination normal.  Skin: Skin is warm and dry. No rash noted. He is not diaphoretic. No erythema. No pallor.  Psychiatric: He has a normal mood and affect. His behavior is normal. Judgment and thought content normal.     EKG: Normal sinus rhythm with incomplete right bundle branch block and left anterior fascicular block.   EKG:   ASSESSMENT AND PLAN

## 2013-08-29 NOTE — Patient Instructions (Addendum)
Your physician wants you to follow-up in: 1 year with Dr. Katheren Puller will receive a reminder letter in the mail two months in advance. If you don't receive a letter, please call our office to schedule the follow-up appointment.  Your physician recommends that you continue on your current medications as directed. Please refer to the Current Medication list given to you today.  Your physician recommends that you return for lab work on day of Coumadin appt 09/19/13:BMP, CBC

## 2013-08-29 NOTE — Assessment & Plan Note (Signed)
He does not have history of hypertension. Continue to monitor. I discussed with him the importance of lifestyle changes and smoking cessation. I also strongly advised him to establish with a primary care physician given that he has not had any age-appropriate screening for cancer. He will be moving out of the area in the next 6 months.

## 2013-09-19 ENCOUNTER — Other Ambulatory Visit (INDEPENDENT_AMBULATORY_CARE_PROVIDER_SITE_OTHER): Payer: Medicare Other

## 2013-09-19 ENCOUNTER — Ambulatory Visit (INDEPENDENT_AMBULATORY_CARE_PROVIDER_SITE_OTHER): Payer: Medicare Other | Admitting: *Deleted

## 2013-09-19 DIAGNOSIS — Z720 Tobacco use: Secondary | ICD-10-CM

## 2013-09-19 DIAGNOSIS — R079 Chest pain, unspecified: Secondary | ICD-10-CM

## 2013-09-19 DIAGNOSIS — F17200 Nicotine dependence, unspecified, uncomplicated: Secondary | ICD-10-CM

## 2013-09-19 DIAGNOSIS — Z7901 Long term (current) use of anticoagulants: Secondary | ICD-10-CM

## 2013-09-19 DIAGNOSIS — R030 Elevated blood-pressure reading, without diagnosis of hypertension: Secondary | ICD-10-CM

## 2013-09-19 DIAGNOSIS — I2699 Other pulmonary embolism without acute cor pulmonale: Secondary | ICD-10-CM

## 2013-09-19 LAB — CBC WITH DIFFERENTIAL/PLATELET
Basophils Absolute: 0 10*3/uL (ref 0.0–0.1)
Eosinophils Absolute: 0.3 10*3/uL (ref 0.0–0.7)
Eosinophils Relative: 3.7 % (ref 0.0–5.0)
HCT: 51.3 % (ref 39.0–52.0)
Hemoglobin: 17.1 g/dL — ABNORMAL HIGH (ref 13.0–17.0)
Lymphs Abs: 1.6 10*3/uL (ref 0.7–4.0)
MCHC: 33.4 g/dL (ref 30.0–36.0)
Monocytes Relative: 9.8 % (ref 3.0–12.0)
Neutro Abs: 5.2 10*3/uL (ref 1.4–7.7)
RDW: 13.9 % (ref 11.5–14.6)

## 2013-09-19 LAB — BASIC METABOLIC PANEL
CO2: 24 mEq/L (ref 19–32)
GFR: 75.15 mL/min (ref >60.00)
Glucose, Bld: 109 mg/dL — ABNORMAL HIGH (ref 70–99)
Potassium: 3.6 mEq/L (ref 3.5–5.1)
Sodium: 133 mEq/L — ABNORMAL LOW (ref 135–145)

## 2013-09-19 LAB — POCT INR: INR: 2.7

## 2013-10-17 ENCOUNTER — Ambulatory Visit (INDEPENDENT_AMBULATORY_CARE_PROVIDER_SITE_OTHER): Payer: Medicare Other | Admitting: *Deleted

## 2013-10-17 DIAGNOSIS — Z7901 Long term (current) use of anticoagulants: Secondary | ICD-10-CM

## 2013-10-17 DIAGNOSIS — I2699 Other pulmonary embolism without acute cor pulmonale: Secondary | ICD-10-CM

## 2013-10-17 LAB — POCT INR: INR: 2.3

## 2013-10-26 ENCOUNTER — Telehealth: Payer: Self-pay | Admitting: *Deleted

## 2013-10-26 MED ORDER — VARENICLINE TARTRATE 1 MG PO TABS: 1.0000 mg | ORAL_TABLET | Freq: Two times a day (BID) | ORAL | Status: DC

## 2013-10-26 MED ORDER — WARFARIN SODIUM 5 MG PO TABS: ORAL_TABLET | ORAL | Status: AC

## 2013-10-26 NOTE — Telephone Encounter (Signed)
Spoke with patient and he tells me that he has been using the Chantix 1 mg bid for the past 2 months.  He still has a small urge to smoke and would like one month more for a total of 3 months.  Also needs a refill on warfarin.  Refill for chantix x 30 day, and refill for warfarin x 90 days sent to pharmacy.  Patient aware and due for protime next month.

## 2013-10-26 NOTE — Telephone Encounter (Signed)
Patient wanted to know if you would be willing to send him in another refill for chantix. Please advise. Thanks, MI

## 2013-11-14 ENCOUNTER — Ambulatory Visit (INDEPENDENT_AMBULATORY_CARE_PROVIDER_SITE_OTHER): Payer: Medicare Other

## 2013-11-14 DIAGNOSIS — Z5181 Encounter for therapeutic drug level monitoring: Secondary | ICD-10-CM

## 2013-11-14 DIAGNOSIS — I2699 Other pulmonary embolism without acute cor pulmonale: Secondary | ICD-10-CM

## 2013-11-14 DIAGNOSIS — Z7901 Long term (current) use of anticoagulants: Secondary | ICD-10-CM

## 2013-11-14 LAB — POCT INR: INR: 3

## 2013-12-26 ENCOUNTER — Ambulatory Visit (INDEPENDENT_AMBULATORY_CARE_PROVIDER_SITE_OTHER): Payer: Medicare Other | Admitting: *Deleted

## 2013-12-26 DIAGNOSIS — Z5181 Encounter for therapeutic drug level monitoring: Secondary | ICD-10-CM

## 2013-12-26 DIAGNOSIS — I2699 Other pulmonary embolism without acute cor pulmonale: Secondary | ICD-10-CM

## 2013-12-26 DIAGNOSIS — Z7901 Long term (current) use of anticoagulants: Secondary | ICD-10-CM

## 2013-12-26 LAB — POCT INR: INR: 3.3

## 2014-01-23 ENCOUNTER — Ambulatory Visit (INDEPENDENT_AMBULATORY_CARE_PROVIDER_SITE_OTHER): Payer: Medicare Other | Admitting: *Deleted

## 2014-01-23 DIAGNOSIS — Z5181 Encounter for therapeutic drug level monitoring: Secondary | ICD-10-CM

## 2014-01-23 DIAGNOSIS — Z7901 Long term (current) use of anticoagulants: Secondary | ICD-10-CM

## 2014-01-23 DIAGNOSIS — I2699 Other pulmonary embolism without acute cor pulmonale: Secondary | ICD-10-CM

## 2014-01-23 LAB — POCT INR: INR: 2.7

## 2014-02-20 ENCOUNTER — Ambulatory Visit (INDEPENDENT_AMBULATORY_CARE_PROVIDER_SITE_OTHER): Payer: Medicare Other | Admitting: Pharmacist Clinician (PhC)/ Clinical Pharmacy Specialist

## 2014-02-20 DIAGNOSIS — I2699 Other pulmonary embolism without acute cor pulmonale: Secondary | ICD-10-CM

## 2014-02-20 DIAGNOSIS — Z5181 Encounter for therapeutic drug level monitoring: Secondary | ICD-10-CM

## 2014-02-20 DIAGNOSIS — Z7901 Long term (current) use of anticoagulants: Secondary | ICD-10-CM

## 2014-02-20 LAB — POCT INR: INR: 3

## 2015-01-23 ENCOUNTER — Telehealth: Payer: Self-pay | Admitting: *Deleted

## 2015-01-23 NOTE — Telephone Encounter (Signed)
LMOM for patient to call us back in regards to past due CVRR appt.

## 2017-10-12 DEATH — deceased

## 2020-12-10 DEATH — deceased
